# Patient Record
Sex: Male | Born: 1955 | Race: Black or African American | Hispanic: No | Marital: Single | Smoking: Current every day smoker
Health system: Southern US, Community
[De-identification: ages and names within clinical notes are randomized; demographics above are authoritative.]

## PROBLEM LIST (undated history)

## (undated) DIAGNOSIS — M1990 Unspecified osteoarthritis, unspecified site: Secondary | ICD-10-CM

## (undated) DIAGNOSIS — N189 Chronic kidney disease, unspecified: Secondary | ICD-10-CM

## (undated) DIAGNOSIS — K219 Gastro-esophageal reflux disease without esophagitis: Secondary | ICD-10-CM

## (undated) DIAGNOSIS — D649 Anemia, unspecified: Secondary | ICD-10-CM

## (undated) DIAGNOSIS — C8590 Non-Hodgkin lymphoma, unspecified, unspecified site: Secondary | ICD-10-CM

## (undated) HISTORY — DX: Anemia, unspecified: D64.9

## (undated) HISTORY — DX: Unspecified osteoarthritis, unspecified site: M19.90

## (undated) HISTORY — DX: Chronic kidney disease, unspecified: N18.9

## (undated) HISTORY — DX: Non-Hodgkin lymphoma, unspecified, unspecified site: C85.90

## (undated) HISTORY — PX: BONE MARROW BIOPSY: SHX199

## (undated) HISTORY — DX: Gastro-esophageal reflux disease without esophagitis: K21.9

---

## 1979-04-12 HISTORY — PX: OTHER SURGICAL HISTORY: SHX169

## 2010-04-11 HISTORY — PX: OTHER SURGICAL HISTORY: SHX169

## 2012-03-21 ENCOUNTER — Ambulatory Visit: Payer: Self-pay | Admitting: Family Medicine

## 2012-05-18 ENCOUNTER — Ambulatory Visit: Payer: Self-pay | Admitting: Internal Medicine

## 2012-05-21 ENCOUNTER — Ambulatory Visit: Payer: Self-pay | Admitting: Internal Medicine

## 2012-06-09 ENCOUNTER — Ambulatory Visit: Payer: Self-pay | Admitting: Internal Medicine

## 2012-11-29 ENCOUNTER — Ambulatory Visit: Payer: Self-pay | Admitting: Internal Medicine

## 2012-11-30 LAB — CBC CANCER CENTER
Basophil #: 0 x10 3/mm (ref 0.0–0.1)
Basophil %: 0.1 %
Eosinophil %: 1 %
HCT: 37.2 % — ABNORMAL LOW (ref 40.0–52.0)
HGB: 12.5 g/dL — ABNORMAL LOW (ref 13.0–18.0)
Lymphocyte %: 27.9 %
MCH: 29.3 pg (ref 26.0–34.0)
MCHC: 33.6 g/dL (ref 32.0–36.0)
MCV: 87 fL (ref 80–100)
Monocyte #: 0.7 x10 3/mm (ref 0.2–1.0)
Neutrophil #: 4.7 x10 3/mm (ref 1.4–6.5)
Neutrophil %: 61.6 %
Platelet: 289 x10 3/mm (ref 150–440)
RDW: 15.1 % — ABNORMAL HIGH (ref 11.5–14.5)
WBC: 7.6 x10 3/mm (ref 3.8–10.6)

## 2012-11-30 LAB — CREATININE, SERUM: EGFR (African American): 44 — ABNORMAL LOW

## 2012-11-30 LAB — CALCIUM: Calcium, Total: 9.3 mg/dL (ref 8.5–10.1)

## 2012-12-03 LAB — URINE IEP, RANDOM

## 2012-12-06 LAB — PROT IMMUNOELECTROPHORES(ARMC)

## 2012-12-10 ENCOUNTER — Ambulatory Visit: Payer: Self-pay | Admitting: Internal Medicine

## 2012-12-12 LAB — CBC CANCER CENTER
Comment - H1-Com4: NORMAL
Eosinophil: 3 %
HCT: 41.1 % (ref 40.0–52.0)
HGB: 13.7 g/dL (ref 13.0–18.0)
Lymphocytes: 26 %
MCH: 29.1 pg (ref 26.0–34.0)
MCV: 87 fL (ref 80–100)
Monocytes: 7 %
Platelet: 317 x10 3/mm (ref 150–440)
RBC: 4.71 10*6/uL (ref 4.40–5.90)
RDW: 14.8 % — ABNORMAL HIGH (ref 11.5–14.5)
Segmented Neutrophils: 64 %

## 2012-12-12 LAB — RETICULOCYTES: Reticulocyte: 1.11 % (ref 0.4–3.1)

## 2013-01-01 LAB — CBC CANCER CENTER
Basophil #: 0.1 x10 3/mm (ref 0.0–0.1)
Eosinophil #: 0.1 x10 3/mm (ref 0.0–0.7)
Eosinophil %: 1.4 %
HCT: 39.9 % — ABNORMAL LOW (ref 40.0–52.0)
Lymphocyte #: 2.4 x10 3/mm (ref 1.0–3.6)
Lymphocyte %: 29.2 %
MCHC: 32.8 g/dL (ref 32.0–36.0)
MCV: 88 fL (ref 80–100)
Monocyte #: 0.7 x10 3/mm (ref 0.2–1.0)
Neutrophil #: 4.8 x10 3/mm (ref 1.4–6.5)
Neutrophil %: 59.4 %
Platelet: 305 x10 3/mm (ref 150–440)
RBC: 4.53 10*6/uL (ref 4.40–5.90)
RDW: 14.7 % — ABNORMAL HIGH (ref 11.5–14.5)

## 2013-01-01 LAB — LACTATE DEHYDROGENASE: LDH: 147 U/L (ref 85–241)

## 2013-01-01 LAB — BASIC METABOLIC PANEL
Anion Gap: 6 — ABNORMAL LOW (ref 7–16)
BUN: 19 mg/dL — ABNORMAL HIGH (ref 7–18)
Calcium, Total: 9.2 mg/dL (ref 8.5–10.1)
Creatinine: 1.71 mg/dL — ABNORMAL HIGH (ref 0.60–1.30)
EGFR (African American): 50 — ABNORMAL LOW
Osmolality: 284 (ref 275–301)
Potassium: 3.7 mmol/L (ref 3.5–5.1)

## 2013-01-01 LAB — HEPATIC FUNCTION PANEL A (ARMC)
Albumin: 3.6 g/dL (ref 3.4–5.0)
Alkaline Phosphatase: 65 U/L (ref 50–136)
SGOT(AST): 20 U/L (ref 15–37)
Total Protein: 7.6 g/dL (ref 6.4–8.2)

## 2013-01-08 LAB — CBC CANCER CENTER
Basophil #: 0.1 x10 3/mm (ref 0.0–0.1)
Basophil %: 1.2 %
Eosinophil %: 2.1 %
HCT: 38.9 % — ABNORMAL LOW (ref 40.0–52.0)
Lymphocyte %: 25.1 %
MCH: 28.5 pg (ref 26.0–34.0)
MCHC: 32.9 g/dL (ref 32.0–36.0)
Monocyte #: 0.6 x10 3/mm (ref 0.2–1.0)
Monocyte %: 8 %
Neutrophil #: 4.5 x10 3/mm (ref 1.4–6.5)
RBC: 4.49 10*6/uL (ref 4.40–5.90)
RDW: 15 % — ABNORMAL HIGH (ref 11.5–14.5)
WBC: 7.1 x10 3/mm (ref 3.8–10.6)

## 2013-01-08 LAB — CREATININE, SERUM: EGFR (African American): 58 — ABNORMAL LOW

## 2013-01-09 ENCOUNTER — Ambulatory Visit: Payer: Self-pay | Admitting: Internal Medicine

## 2013-01-15 LAB — CBC CANCER CENTER
Basophil #: 0.1 x10 3/mm (ref 0.0–0.1)
Eosinophil #: 0.1 x10 3/mm (ref 0.0–0.7)
Eosinophil %: 1.8 %
Lymphocyte #: 2 x10 3/mm (ref 1.0–3.6)
Monocyte #: 0.7 x10 3/mm (ref 0.2–1.0)
Monocyte %: 9.4 %
Neutrophil #: 4.3 x10 3/mm (ref 1.4–6.5)
Neutrophil %: 59.8 %
Platelet: 329 x10 3/mm (ref 150–440)

## 2013-01-22 LAB — CBC CANCER CENTER
Basophil #: 0.1 x10 3/mm (ref 0.0–0.1)
Eosinophil #: 0.1 x10 3/mm (ref 0.0–0.7)
HGB: 12.7 g/dL — ABNORMAL LOW (ref 13.0–18.0)
Lymphocyte #: 2.8 x10 3/mm (ref 1.0–3.6)
Lymphocyte %: 29 %
MCHC: 33.6 g/dL (ref 32.0–36.0)
Monocyte #: 0.9 x10 3/mm (ref 0.2–1.0)
Monocyte %: 9 %
RDW: 14.9 % — ABNORMAL HIGH (ref 11.5–14.5)
WBC: 9.5 x10 3/mm (ref 3.8–10.6)

## 2013-01-22 LAB — BASIC METABOLIC PANEL
Anion Gap: 8 (ref 7–16)
BUN: 21 mg/dL — ABNORMAL HIGH (ref 7–18)
Calcium, Total: 9.1 mg/dL (ref 8.5–10.1)
Chloride: 106 mmol/L (ref 98–107)
Co2: 23 mmol/L (ref 21–32)
Creatinine: 1.76 mg/dL — ABNORMAL HIGH (ref 0.60–1.30)
Glucose: 91 mg/dL (ref 65–99)
Osmolality: 276 (ref 275–301)
Potassium: 4.1 mmol/L (ref 3.5–5.1)

## 2013-01-29 LAB — CBC CANCER CENTER
Basophil #: 0.1 x10 3/mm (ref 0.0–0.1)
Basophil %: 1 %
Eosinophil #: 0.1 x10 3/mm (ref 0.0–0.7)
Eosinophil %: 1.6 %
HCT: 38.4 % — ABNORMAL LOW (ref 40.0–52.0)
Lymphocyte #: 1.8 x10 3/mm (ref 1.0–3.6)
Lymphocyte %: 24.4 %
MCH: 29.2 pg (ref 26.0–34.0)
MCHC: 33.4 g/dL (ref 32.0–36.0)
MCV: 87 fL (ref 80–100)
Monocyte #: 0.7 x10 3/mm (ref 0.2–1.0)
Neutrophil #: 4.8 x10 3/mm (ref 1.4–6.5)
Neutrophil %: 63.6 %
RBC: 4.39 10*6/uL — ABNORMAL LOW (ref 4.40–5.90)
WBC: 7.5 x10 3/mm (ref 3.8–10.6)

## 2013-02-09 ENCOUNTER — Ambulatory Visit: Payer: Self-pay | Admitting: Internal Medicine

## 2013-03-26 ENCOUNTER — Ambulatory Visit: Payer: Self-pay | Admitting: Internal Medicine

## 2013-03-26 LAB — BASIC METABOLIC PANEL
BUN: 18 mg/dL (ref 7–18)
Calcium, Total: 9.1 mg/dL (ref 8.5–10.1)
Chloride: 104 mmol/L (ref 98–107)
EGFR (African American): 45 — ABNORMAL LOW
Glucose: 89 mg/dL (ref 65–99)
Potassium: 4.1 mmol/L (ref 3.5–5.1)
Sodium: 139 mmol/L (ref 136–145)

## 2013-03-26 LAB — HEPATIC FUNCTION PANEL A (ARMC)
Albumin: 3.5 g/dL (ref 3.4–5.0)
Alkaline Phosphatase: 50 U/L
Bilirubin, Direct: 0.1 mg/dL (ref 0.00–0.20)
Bilirubin,Total: 0.3 mg/dL (ref 0.2–1.0)
SGPT (ALT): 19 U/L (ref 12–78)
Total Protein: 7.4 g/dL (ref 6.4–8.2)

## 2013-03-26 LAB — CBC CANCER CENTER
Basophil %: 1.6 %
Eosinophil #: 0.1 x10 3/mm (ref 0.0–0.7)
Eosinophil %: 1.8 %
HGB: 12.9 g/dL — ABNORMAL LOW (ref 13.0–18.0)
MCH: 28.5 pg (ref 26.0–34.0)
MCHC: 32.4 g/dL (ref 32.0–36.0)
MCV: 88 fL (ref 80–100)
Neutrophil #: 3.2 x10 3/mm (ref 1.4–6.5)
Neutrophil %: 55.4 %
Platelet: 281 x10 3/mm (ref 150–440)
RBC: 4.54 10*6/uL (ref 4.40–5.90)
WBC: 5.8 x10 3/mm (ref 3.8–10.6)

## 2013-04-11 ENCOUNTER — Ambulatory Visit: Payer: Self-pay | Admitting: Internal Medicine

## 2013-05-07 LAB — CBC CANCER CENTER
Basophil #: 0.1 x10 3/mm (ref 0.0–0.1)
Basophil %: 1.4 %
Eosinophil #: 0.1 x10 3/mm (ref 0.0–0.7)
Eosinophil %: 1.6 %
HCT: 41 % (ref 40.0–52.0)
HGB: 13.3 g/dL (ref 13.0–18.0)
LYMPHS ABS: 2.7 x10 3/mm (ref 1.0–3.6)
LYMPHS PCT: 38.7 %
MCH: 28.2 pg (ref 26.0–34.0)
MCHC: 32.5 g/dL (ref 32.0–36.0)
MCV: 87 fL (ref 80–100)
MONO ABS: 0.8 x10 3/mm (ref 0.2–1.0)
Monocyte %: 11.8 %
NEUTROS PCT: 46.5 %
Neutrophil #: 3.3 x10 3/mm (ref 1.4–6.5)
Platelet: 309 x10 3/mm (ref 150–440)
RBC: 4.72 10*6/uL (ref 4.40–5.90)
RDW: 15.5 % — ABNORMAL HIGH (ref 11.5–14.5)
WBC: 7 x10 3/mm (ref 3.8–10.6)

## 2013-05-12 ENCOUNTER — Ambulatory Visit: Payer: Self-pay | Admitting: Internal Medicine

## 2013-06-18 ENCOUNTER — Ambulatory Visit: Payer: Self-pay | Admitting: Internal Medicine

## 2013-06-18 LAB — CBC CANCER CENTER
BASOS ABS: 0.1 x10 3/mm (ref 0.0–0.1)
Basophil %: 1.1 %
EOS PCT: 2.1 %
Eosinophil #: 0.1 x10 3/mm (ref 0.0–0.7)
Eosinophil: 1 %
HCT: 38.6 % — AB (ref 40.0–52.0)
HGB: 12.4 g/dL — ABNORMAL LOW (ref 13.0–18.0)
LYMPHS ABS: 1.7 x10 3/mm (ref 1.0–3.6)
LYMPHS PCT: 39 %
Lymphocyte %: 30.9 %
MCH: 28.4 pg (ref 26.0–34.0)
MCHC: 32.2 g/dL (ref 32.0–36.0)
MCV: 88 fL (ref 80–100)
MONOS PCT: 9 %
Monocyte #: 0.6 x10 3/mm (ref 0.2–1.0)
Monocyte %: 10.5 %
Neutrophil #: 3 x10 3/mm (ref 1.4–6.5)
Neutrophil %: 55.4 %
PLATELETS: 263 x10 3/mm (ref 150–440)
RBC: 4.38 10*6/uL — ABNORMAL LOW (ref 4.40–5.90)
RDW: 15.2 % — ABNORMAL HIGH (ref 11.5–14.5)
SEGMENTED NEUTROPHILS: 51 %
WBC: 5.4 x10 3/mm (ref 3.8–10.6)

## 2013-06-18 LAB — CREATININE, SERUM
CREATININE: 1.78 mg/dL — AB (ref 0.60–1.30)
EGFR (African American): 48 — ABNORMAL LOW
EGFR (Non-African Amer.): 41 — ABNORMAL LOW

## 2013-06-18 LAB — HEPATIC FUNCTION PANEL A (ARMC)
Albumin: 3.3 g/dL — ABNORMAL LOW (ref 3.4–5.0)
Alkaline Phosphatase: 53 U/L
Bilirubin, Direct: 0.1 mg/dL (ref 0.00–0.20)
Bilirubin,Total: 0.3 mg/dL (ref 0.2–1.0)
SGOT(AST): 15 U/L (ref 15–37)
SGPT (ALT): 20 U/L (ref 12–78)
Total Protein: 6.8 g/dL (ref 6.4–8.2)

## 2013-06-18 LAB — LACTATE DEHYDROGENASE: LDH: 142 U/L (ref 85–241)

## 2013-07-10 ENCOUNTER — Ambulatory Visit: Payer: Self-pay | Admitting: Internal Medicine

## 2013-07-17 LAB — CBC CANCER CENTER
BASOS ABS: 0.1 x10 3/mm (ref 0.0–0.1)
Basophil %: 1 %
EOS PCT: 1.1 %
Eosinophil #: 0.1 x10 3/mm (ref 0.0–0.7)
HCT: 41.6 % (ref 40.0–52.0)
HGB: 13.5 g/dL (ref 13.0–18.0)
LYMPHS PCT: 28.9 %
Lymphocyte #: 2.2 x10 3/mm (ref 1.0–3.6)
MCH: 28.9 pg (ref 26.0–34.0)
MCHC: 32.4 g/dL (ref 32.0–36.0)
MCV: 89 fL (ref 80–100)
MONO ABS: 0.7 x10 3/mm (ref 0.2–1.0)
Monocyte %: 9.9 %
Neutrophil #: 4.4 x10 3/mm (ref 1.4–6.5)
Neutrophil %: 59.1 %
Platelet: 301 x10 3/mm (ref 150–440)
RBC: 4.67 10*6/uL (ref 4.40–5.90)
RDW: 15.6 % — AB (ref 11.5–14.5)
WBC: 7.5 x10 3/mm (ref 3.8–10.6)

## 2013-07-17 LAB — BASIC METABOLIC PANEL
Anion Gap: 4 — ABNORMAL LOW (ref 7–16)
BUN: 19 mg/dL — ABNORMAL HIGH (ref 7–18)
CO2: 25 mmol/L (ref 21–32)
CREATININE: 1.92 mg/dL — AB (ref 0.60–1.30)
Calcium, Total: 8.7 mg/dL (ref 8.5–10.1)
Chloride: 109 mmol/L — ABNORMAL HIGH (ref 98–107)
EGFR (African American): 44 — ABNORMAL LOW
EGFR (Non-African Amer.): 38 — ABNORMAL LOW
Glucose: 106 mg/dL — ABNORMAL HIGH (ref 65–99)
Osmolality: 278 (ref 275–301)
POTASSIUM: 4 mmol/L (ref 3.5–5.1)
SODIUM: 138 mmol/L (ref 136–145)

## 2013-07-30 LAB — CBC CANCER CENTER
BASOS ABS: 0.1 x10 3/mm (ref 0.0–0.1)
BASOS PCT: 1 %
Eosinophil #: 0.1 x10 3/mm (ref 0.0–0.7)
Eosinophil %: 0.8 %
HCT: 41.8 % (ref 40.0–52.0)
HGB: 13.6 g/dL (ref 13.0–18.0)
LYMPHS PCT: 28 %
Lymphocyte #: 1.8 x10 3/mm (ref 1.0–3.6)
MCH: 29 pg (ref 26.0–34.0)
MCHC: 32.6 g/dL (ref 32.0–36.0)
MCV: 89 fL (ref 80–100)
MONO ABS: 0.6 x10 3/mm (ref 0.2–1.0)
Monocyte %: 10 %
NEUTROS ABS: 3.8 x10 3/mm (ref 1.4–6.5)
Neutrophil %: 60.2 %
Platelet: 310 x10 3/mm (ref 150–440)
RBC: 4.7 10*6/uL (ref 4.40–5.90)
RDW: 15.5 % — AB (ref 11.5–14.5)
WBC: 6.4 x10 3/mm (ref 3.8–10.6)

## 2013-08-09 ENCOUNTER — Ambulatory Visit: Payer: Self-pay | Admitting: Internal Medicine

## 2013-09-09 ENCOUNTER — Ambulatory Visit: Payer: Self-pay | Admitting: Internal Medicine

## 2013-09-10 LAB — HEPATIC FUNCTION PANEL A (ARMC)
Albumin: 3.6 g/dL (ref 3.4–5.0)
Alkaline Phosphatase: 55 U/L
BILIRUBIN TOTAL: 0.6 mg/dL (ref 0.2–1.0)
Bilirubin, Direct: 0.2 mg/dL (ref 0.00–0.20)
SGOT(AST): 19 U/L (ref 15–37)
SGPT (ALT): 20 U/L (ref 12–78)
Total Protein: 6.9 g/dL (ref 6.4–8.2)

## 2013-09-10 LAB — CBC CANCER CENTER
BASOS ABS: 0.1 x10 3/mm (ref 0.0–0.1)
Basophil %: 1.2 %
EOS PCT: 1.4 %
Eosinophil #: 0.1 x10 3/mm (ref 0.0–0.7)
HCT: 38.1 % — ABNORMAL LOW (ref 40.0–52.0)
HGB: 12.8 g/dL — ABNORMAL LOW (ref 13.0–18.0)
LYMPHS ABS: 1.9 x10 3/mm (ref 1.0–3.6)
Lymphocyte %: 22.9 %
MCH: 29.8 pg (ref 26.0–34.0)
MCHC: 33.6 g/dL (ref 32.0–36.0)
MCV: 89 fL (ref 80–100)
Monocyte #: 0.7 x10 3/mm (ref 0.2–1.0)
Monocyte %: 8.4 %
NEUTROS ABS: 5.6 x10 3/mm (ref 1.4–6.5)
Neutrophil %: 66.1 %
PLATELETS: 247 x10 3/mm (ref 150–440)
RBC: 4.29 10*6/uL — AB (ref 4.40–5.90)
RDW: 15.2 % — AB (ref 11.5–14.5)
WBC: 8.4 x10 3/mm (ref 3.8–10.6)

## 2013-09-10 LAB — CREATININE, SERUM
CREATININE: 1.61 mg/dL — AB (ref 0.60–1.30)
EGFR (African American): 54 — ABNORMAL LOW
EGFR (Non-African Amer.): 46 — ABNORMAL LOW

## 2013-09-10 LAB — LACTATE DEHYDROGENASE: LDH: 145 U/L (ref 85–241)

## 2013-10-09 ENCOUNTER — Ambulatory Visit: Payer: Self-pay | Admitting: Internal Medicine

## 2013-10-22 LAB — CBC CANCER CENTER
BASOS PCT: 1.4 %
Basophil #: 0.1 x10 3/mm (ref 0.0–0.1)
EOS ABS: 0.1 x10 3/mm (ref 0.0–0.7)
Eosinophil %: 1.8 %
HCT: 40.7 % (ref 40.0–52.0)
HGB: 13.2 g/dL (ref 13.0–18.0)
LYMPHS PCT: 32.7 %
Lymphocyte #: 1.8 x10 3/mm (ref 1.0–3.6)
MCH: 29.1 pg (ref 26.0–34.0)
MCHC: 32.4 g/dL (ref 32.0–36.0)
MCV: 90 fL (ref 80–100)
Monocyte #: 0.7 x10 3/mm (ref 0.2–1.0)
Monocyte %: 12.2 %
Neutrophil #: 2.8 x10 3/mm (ref 1.4–6.5)
Neutrophil %: 51.9 %
Platelet: 309 x10 3/mm (ref 150–440)
RBC: 4.52 10*6/uL (ref 4.40–5.90)
RDW: 15.2 % — AB (ref 11.5–14.5)
WBC: 5.4 x10 3/mm (ref 3.8–10.6)

## 2013-10-22 LAB — RETICULOCYTES
ABSOLUTE RETIC COUNT: 0.0359 10*6/uL (ref 0.019–0.186)
RETICULOCYTE: 0.79 % (ref 0.4–3.1)

## 2013-11-08 LAB — CBC CANCER CENTER
BASOS ABS: 0 x10 3/mm (ref 0.0–0.1)
Basophil %: 0.3 %
EOS PCT: 1.3 %
Eosinophil #: 0.1 x10 3/mm (ref 0.0–0.7)
HCT: 39.5 % — ABNORMAL LOW (ref 40.0–52.0)
HGB: 12.9 g/dL — AB (ref 13.0–18.0)
LYMPHS ABS: 1.8 x10 3/mm (ref 1.0–3.6)
LYMPHS PCT: 29.9 %
MCH: 29 pg (ref 26.0–34.0)
MCHC: 32.7 g/dL (ref 32.0–36.0)
MCV: 89 fL (ref 80–100)
Monocyte #: 0.7 x10 3/mm (ref 0.2–1.0)
Monocyte %: 11.3 %
Neutrophil #: 3.4 x10 3/mm (ref 1.4–6.5)
Neutrophil %: 57.2 %
Platelet: 293 x10 3/mm (ref 150–440)
RBC: 4.44 10*6/uL (ref 4.40–5.90)
RDW: 14.8 % — ABNORMAL HIGH (ref 11.5–14.5)
WBC: 6 x10 3/mm (ref 3.8–10.6)

## 2013-11-08 LAB — BASIC METABOLIC PANEL
ANION GAP: 8 (ref 7–16)
BUN: 18 mg/dL (ref 7–18)
CO2: 27 mmol/L (ref 21–32)
Calcium, Total: 8.8 mg/dL (ref 8.5–10.1)
Chloride: 104 mmol/L (ref 98–107)
Creatinine: 1.61 mg/dL — ABNORMAL HIGH (ref 0.60–1.30)
EGFR (African American): 53 — ABNORMAL LOW
EGFR (Non-African Amer.): 46 — ABNORMAL LOW
Glucose: 92 mg/dL (ref 65–99)
OSMOLALITY: 279 (ref 275–301)
POTASSIUM: 4.3 mmol/L (ref 3.5–5.1)
Sodium: 139 mmol/L (ref 136–145)

## 2013-11-09 ENCOUNTER — Ambulatory Visit: Payer: Self-pay | Admitting: Internal Medicine

## 2014-01-03 ENCOUNTER — Ambulatory Visit: Payer: Self-pay | Admitting: Internal Medicine

## 2014-01-03 LAB — HEPATIC FUNCTION PANEL A (ARMC)
ALBUMIN: 3.8 g/dL (ref 3.4–5.0)
Alkaline Phosphatase: 55 U/L
Bilirubin, Direct: 0.1 mg/dL (ref 0.00–0.20)
Bilirubin,Total: 0.5 mg/dL (ref 0.2–1.0)
SGOT(AST): 17 U/L (ref 15–37)
SGPT (ALT): 31 U/L
Total Protein: 6.9 g/dL (ref 6.4–8.2)

## 2014-01-03 LAB — CBC CANCER CENTER
BASOS PCT: 0.8 %
Basophil #: 0.1 x10 3/mm (ref 0.0–0.1)
EOS PCT: 1.5 %
Eosinophil #: 0.1 x10 3/mm (ref 0.0–0.7)
HCT: 40 % (ref 40.0–52.0)
HGB: 13 g/dL (ref 13.0–18.0)
Lymphocyte #: 1.9 x10 3/mm (ref 1.0–3.6)
Lymphocyte %: 25.1 %
MCH: 29 pg (ref 26.0–34.0)
MCHC: 32.6 g/dL (ref 32.0–36.0)
MCV: 89 fL (ref 80–100)
Monocyte #: 0.7 x10 3/mm (ref 0.2–1.0)
Monocyte %: 9 %
Neutrophil #: 4.9 x10 3/mm (ref 1.4–6.5)
Neutrophil %: 63.6 %
Platelet: 292 x10 3/mm (ref 150–440)
RBC: 4.49 10*6/uL (ref 4.40–5.90)
RDW: 14.9 % — AB (ref 11.5–14.5)
WBC: 7.7 x10 3/mm (ref 3.8–10.6)

## 2014-01-03 LAB — CREATININE, SERUM
Creatinine: 1.59 mg/dL — ABNORMAL HIGH (ref 0.60–1.30)
EGFR (African American): 58 — ABNORMAL LOW
EGFR (Non-African Amer.): 48 — ABNORMAL LOW

## 2014-01-03 LAB — LACTATE DEHYDROGENASE: LDH: 147 U/L (ref 85–241)

## 2014-01-09 ENCOUNTER — Ambulatory Visit: Payer: Self-pay | Admitting: Internal Medicine

## 2014-02-28 ENCOUNTER — Ambulatory Visit: Payer: Self-pay | Admitting: Internal Medicine

## 2014-02-28 LAB — CBC CANCER CENTER
Basophil #: 0.1 x10 3/mm (ref 0.0–0.1)
Basophil %: 0.8 %
EOS ABS: 0.1 x10 3/mm (ref 0.0–0.7)
Eosinophil %: 1.2 %
HCT: 39.4 % — ABNORMAL LOW (ref 40.0–52.0)
HGB: 12.7 g/dL — ABNORMAL LOW (ref 13.0–18.0)
Lymphocyte #: 1.6 x10 3/mm (ref 1.0–3.6)
Lymphocyte %: 25.8 %
MCH: 28.7 pg (ref 26.0–34.0)
MCHC: 32.1 g/dL (ref 32.0–36.0)
MCV: 89 fL (ref 80–100)
Monocyte #: 0.7 x10 3/mm (ref 0.2–1.0)
Monocyte %: 11.3 %
NEUTROS ABS: 3.7 x10 3/mm (ref 1.4–6.5)
Neutrophil %: 60.9 %
Platelet: 287 x10 3/mm (ref 150–440)
RBC: 4.42 10*6/uL (ref 4.40–5.90)
RDW: 14.6 % — AB (ref 11.5–14.5)
WBC: 6.1 x10 3/mm (ref 3.8–10.6)

## 2014-02-28 LAB — CREATININE, SERUM
CREATININE: 1.79 mg/dL — AB (ref 0.60–1.30)
EGFR (African American): 50 — ABNORMAL LOW
EGFR (Non-African Amer.): 42 — ABNORMAL LOW

## 2014-02-28 LAB — HEPATIC FUNCTION PANEL A (ARMC)
Albumin: 3.5 g/dL (ref 3.4–5.0)
Alkaline Phosphatase: 53 U/L
Bilirubin, Direct: 0.1 mg/dL (ref 0.0–0.2)
Bilirubin,Total: 0.3 mg/dL (ref 0.2–1.0)
SGOT(AST): 14 U/L — ABNORMAL LOW (ref 15–37)
SGPT (ALT): 19 U/L
Total Protein: 6.6 g/dL (ref 6.4–8.2)

## 2014-02-28 LAB — LACTATE DEHYDROGENASE: LDH: 136 U/L (ref 85–241)

## 2014-03-11 ENCOUNTER — Ambulatory Visit: Payer: Self-pay | Admitting: Internal Medicine

## 2014-04-25 ENCOUNTER — Ambulatory Visit: Payer: Self-pay | Admitting: Internal Medicine

## 2014-04-25 LAB — CBC CANCER CENTER
Basophil #: 0 x10 3/mm (ref 0.0–0.1)
Basophil %: 0.1 %
EOS PCT: 1.1 %
Eosinophil #: 0.1 x10 3/mm (ref 0.0–0.7)
HCT: 38.5 % — ABNORMAL LOW (ref 40.0–52.0)
HGB: 12.4 g/dL — AB (ref 13.0–18.0)
Lymphocyte #: 1.4 x10 3/mm (ref 1.0–3.6)
Lymphocyte %: 25.8 %
MCH: 28.2 pg (ref 26.0–34.0)
MCHC: 32.3 g/dL (ref 32.0–36.0)
MCV: 87 fL (ref 80–100)
MONOS PCT: 9.2 %
Monocyte #: 0.5 x10 3/mm (ref 0.2–1.0)
NEUTROS ABS: 3.4 x10 3/mm (ref 1.4–6.5)
Neutrophil %: 63.8 %
Platelet: 271 x10 3/mm (ref 150–440)
RBC: 4.4 10*6/uL (ref 4.40–5.90)
RDW: 15.1 % — ABNORMAL HIGH (ref 11.5–14.5)
WBC: 5.4 x10 3/mm (ref 3.8–10.6)

## 2014-04-25 LAB — CREATININE, SERUM
CREATININE: 1.85 mg/dL — AB (ref 0.60–1.30)
EGFR (African American): 48 — ABNORMAL LOW
GFR CALC NON AF AMER: 40 — AB

## 2014-04-25 LAB — LACTATE DEHYDROGENASE: LDH: 164 U/L (ref 85–241)

## 2014-05-12 ENCOUNTER — Ambulatory Visit: Payer: Self-pay | Admitting: Internal Medicine

## 2014-06-27 ENCOUNTER — Ambulatory Visit
Admit: 2014-06-27 | Disposition: A | Discharge status: Home / Self Care | Payer: Self-pay | Attending: Internal Medicine | Admitting: Internal Medicine

## 2014-06-27 LAB — CREATININE, SERUM: Creatine, Serum: 1.59

## 2014-07-11 ENCOUNTER — Ambulatory Visit
Admit: 2014-07-11 | Disposition: A | Discharge status: Home / Self Care | Payer: Self-pay | Attending: Internal Medicine | Admitting: Internal Medicine

## 2014-08-19 ENCOUNTER — Other Ambulatory Visit: Payer: Self-pay | Admitting: *Deleted

## 2014-08-19 DIAGNOSIS — D47Z9 Other specified neoplasms of uncertain behavior of lymphoid, hematopoietic and related tissue: Secondary | ICD-10-CM

## 2014-08-20 ENCOUNTER — Other Ambulatory Visit: Payer: Self-pay | Admitting: Internal Medicine

## 2014-08-20 DIAGNOSIS — C8590 Non-Hodgkin lymphoma, unspecified, unspecified site: Secondary | ICD-10-CM | POA: Insufficient documentation

## 2014-08-22 ENCOUNTER — Inpatient Hospital Stay: Payer: BLUE CROSS/BLUE SHIELD | Attending: Internal Medicine

## 2014-08-22 ENCOUNTER — Inpatient Hospital Stay: Payer: BLUE CROSS/BLUE SHIELD

## 2014-08-22 ENCOUNTER — Encounter (INDEPENDENT_AMBULATORY_CARE_PROVIDER_SITE_OTHER): Payer: Self-pay

## 2014-08-22 VITALS — BP 112/71 | HR 74 | Temp 96.0°F | Resp 18

## 2014-08-22 DIAGNOSIS — Z5111 Encounter for antineoplastic chemotherapy: Secondary | ICD-10-CM | POA: Insufficient documentation

## 2014-08-22 DIAGNOSIS — D479 Neoplasm of uncertain behavior of lymphoid, hematopoietic and related tissue, unspecified: Secondary | ICD-10-CM | POA: Diagnosis present

## 2014-08-22 DIAGNOSIS — D472 Monoclonal gammopathy: Secondary | ICD-10-CM | POA: Insufficient documentation

## 2014-08-22 DIAGNOSIS — D47Z9 Other specified neoplasms of uncertain behavior of lymphoid, hematopoietic and related tissue: Secondary | ICD-10-CM

## 2014-08-22 DIAGNOSIS — D631 Anemia in chronic kidney disease: Secondary | ICD-10-CM | POA: Diagnosis not present

## 2014-08-22 DIAGNOSIS — C8590 Non-Hodgkin lymphoma, unspecified, unspecified site: Secondary | ICD-10-CM

## 2014-08-22 DIAGNOSIS — Z79899 Other long term (current) drug therapy: Secondary | ICD-10-CM | POA: Insufficient documentation

## 2014-08-22 DIAGNOSIS — N183 Chronic kidney disease, stage 3 (moderate): Secondary | ICD-10-CM | POA: Insufficient documentation

## 2014-08-22 LAB — CBC WITH DIFFERENTIAL/PLATELET
BASOS ABS: 0.1 10*3/uL (ref 0–0.1)
BASOS PCT: 1 %
Eosinophils Absolute: 0.1 10*3/uL (ref 0–0.7)
Eosinophils Relative: 1 %
HEMATOCRIT: 40 % (ref 40.0–52.0)
HEMOGLOBIN: 12.9 g/dL — AB (ref 13.0–18.0)
Lymphocytes Relative: 37 %
Lymphs Abs: 2.6 10*3/uL (ref 1.0–3.6)
MCH: 28.7 pg (ref 26.0–34.0)
MCHC: 32.3 g/dL (ref 32.0–36.0)
MCV: 88.9 fL (ref 80.0–100.0)
MONOS PCT: 8 %
Monocytes Absolute: 0.6 10*3/uL (ref 0.2–1.0)
NEUTROS PCT: 53 %
Neutro Abs: 3.9 10*3/uL (ref 1.4–6.5)
Platelets: 254 10*3/uL (ref 150–440)
RBC: 4.5 MIL/uL (ref 4.40–5.90)
RDW: 14.8 % — AB (ref 11.5–14.5)
WBC: 7.2 10*3/uL (ref 3.8–10.6)

## 2014-08-22 MED ORDER — SODIUM CHLORIDE 0.9 % IV SOLN
10.0000 mg | Freq: Once | INTRAVENOUS | Status: AC
  Administered 2014-08-22: 10 mg via INTRAVENOUS
  Filled 2014-08-22: qty 1

## 2014-08-22 MED ORDER — SODIUM CHLORIDE 0.9 % IV SOLN
Freq: Once | INTRAVENOUS | Status: AC
  Administered 2014-08-22: 10:00:00 via INTRAVENOUS
  Filled 2014-08-22: qty 250

## 2014-08-22 MED ORDER — SODIUM CHLORIDE 0.9 % IV SOLN
600.0000 mg | Freq: Once | INTRAVENOUS | Status: AC
  Administered 2014-08-22: 600 mg via INTRAVENOUS
  Filled 2014-08-22: qty 50

## 2014-08-22 MED ORDER — ACETAMINOPHEN 325 MG PO TABS
650.0000 mg | ORAL_TABLET | Freq: Once | ORAL | Status: AC
  Administered 2014-08-22: 650 mg via ORAL
  Filled 2014-08-22: qty 2

## 2014-08-22 MED ORDER — DIPHENHYDRAMINE HCL 50 MG/ML IJ SOLN
25.0000 mg | Freq: Once | INTRAMUSCULAR | Status: AC
  Administered 2014-08-22: 25 mg via INTRAVENOUS
  Filled 2014-08-22: qty 1

## 2014-08-22 MED ORDER — RITUXIMAB CHEMO INJECTION 500 MG/50ML: 375.0000 mg/m2 | Freq: Once | INTRAVENOUS | Status: DC

## 2014-10-17 ENCOUNTER — Inpatient Hospital Stay (HOSPITAL_BASED_OUTPATIENT_CLINIC_OR_DEPARTMENT_OTHER): Payer: BLUE CROSS/BLUE SHIELD | Admitting: Internal Medicine

## 2014-10-17 ENCOUNTER — Encounter: Payer: Self-pay | Admitting: Internal Medicine

## 2014-10-17 ENCOUNTER — Inpatient Hospital Stay: Payer: BLUE CROSS/BLUE SHIELD

## 2014-10-17 ENCOUNTER — Inpatient Hospital Stay: Payer: BLUE CROSS/BLUE SHIELD | Attending: Internal Medicine

## 2014-10-17 VITALS — BP 123/83 | HR 84 | Temp 96.9°F | Resp 16 | Wt 152.8 lb

## 2014-10-17 DIAGNOSIS — N183 Chronic kidney disease, stage 3 (moderate): Secondary | ICD-10-CM

## 2014-10-17 DIAGNOSIS — Z5111 Encounter for antineoplastic chemotherapy: Secondary | ICD-10-CM | POA: Diagnosis not present

## 2014-10-17 DIAGNOSIS — D472 Monoclonal gammopathy: Secondary | ICD-10-CM | POA: Insufficient documentation

## 2014-10-17 DIAGNOSIS — C8590 Non-Hodgkin lymphoma, unspecified, unspecified site: Secondary | ICD-10-CM

## 2014-10-17 DIAGNOSIS — D631 Anemia in chronic kidney disease: Secondary | ICD-10-CM

## 2014-10-17 DIAGNOSIS — Z79899 Other long term (current) drug therapy: Secondary | ICD-10-CM | POA: Insufficient documentation

## 2014-10-17 DIAGNOSIS — D479 Neoplasm of uncertain behavior of lymphoid, hematopoietic and related tissue, unspecified: Secondary | ICD-10-CM | POA: Insufficient documentation

## 2014-10-17 DIAGNOSIS — D47Z9 Other specified neoplasms of uncertain behavior of lymphoid, hematopoietic and related tissue: Secondary | ICD-10-CM

## 2014-10-17 LAB — CBC WITH DIFFERENTIAL/PLATELET
BASOS PCT: 0 %
Basophils Absolute: 0 10*3/uL (ref 0–0.1)
EOS ABS: 0 10*3/uL (ref 0–0.7)
Eosinophils Relative: 1 %
HCT: 43.1 % (ref 40.0–52.0)
Hemoglobin: 14.2 g/dL (ref 13.0–18.0)
Lymphocytes Relative: 19 %
Lymphs Abs: 0.8 10*3/uL — ABNORMAL LOW (ref 1.0–3.6)
MCH: 29.1 pg (ref 26.0–34.0)
MCHC: 32.9 g/dL (ref 32.0–36.0)
MCV: 88.5 fL (ref 80.0–100.0)
MONO ABS: 0.4 10*3/uL (ref 0.2–1.0)
Monocytes Relative: 9 %
NEUTROS PCT: 71 %
Neutro Abs: 3.1 10*3/uL (ref 1.4–6.5)
Platelets: 223 10*3/uL (ref 150–440)
RBC: 4.87 MIL/uL (ref 4.40–5.90)
RDW: 14.8 % — AB (ref 11.5–14.5)
WBC: 4.4 10*3/uL (ref 3.8–10.6)

## 2014-10-17 LAB — LACTATE DEHYDROGENASE: LDH: 146 U/L (ref 98–192)

## 2014-10-17 LAB — CREATININE, SERUM
Creatinine, Ser: 1.67 mg/dL — ABNORMAL HIGH (ref 0.61–1.24)
GFR calc Af Amer: 50 mL/min — ABNORMAL LOW (ref >60)
GFR, EST NON AFRICAN AMERICAN: 43 mL/min — AB (ref >60)

## 2014-10-17 MED ORDER — SODIUM CHLORIDE 0.9 % IV SOLN
Freq: Once | INTRAVENOUS | Status: AC
  Administered 2014-10-17: 10:00:00 via INTRAVENOUS
  Filled 2014-10-17: qty 1000

## 2014-10-17 MED ORDER — DIPHENHYDRAMINE HCL 50 MG/ML IJ SOLN
25.0000 mg | Freq: Once | INTRAMUSCULAR | Status: AC
  Administered 2014-10-17: 25 mg via INTRAVENOUS
  Filled 2014-10-17: qty 1

## 2014-10-17 MED ORDER — ACETAMINOPHEN 325 MG PO TABS
650.0000 mg | ORAL_TABLET | Freq: Once | ORAL | Status: AC
  Administered 2014-10-17: 650 mg via ORAL
  Filled 2014-10-17: qty 2

## 2014-10-17 MED ORDER — SODIUM CHLORIDE 0.9 % IV SOLN
10.0000 mg | Freq: Once | INTRAVENOUS | Status: AC
  Administered 2014-10-17: 10 mg via INTRAVENOUS
  Filled 2014-10-17: qty 1

## 2014-10-17 MED ORDER — SODIUM CHLORIDE 0.9 % IV SOLN
375.0000 mg/m2 | Freq: Once | INTRAVENOUS | Status: AC
  Administered 2014-10-17: 600 mg via INTRAVENOUS
  Filled 2014-10-17: qty 50

## 2014-10-17 MED ORDER — SODIUM CHLORIDE 0.9 % IV SOLN: 375.0000 mg/m2 | Freq: Once | INTRAVENOUS | Status: DC

## 2014-10-17 NOTE — Progress Notes (Signed)
Family History-Remarkable for diabetes, heart diease, and prostate cancer.Marland KitchenMarland Kitchen

## 2014-10-26 NOTE — Progress Notes (Signed)
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   Per discussion with pathologist Dr. 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  (Bone marrow biopsy 12/12/12 reported normocellular to mildly hypercellular marrow for age ~30-40% with trilineage hematopoiesis, storage iron detected, involvement by low-grade B cell lymphoproliferative disorder interstitial infiltrative pattern ~30-40% of marrow cells.  Flow cytometry shows clonal B-cell population 6% which is CD20+, CD22+ dim CD5-, CD10-, CD11c-, CD19+, CD45+, CD38+, CD23-/+, CD103-, FMC-7+, HLA-DR+, slg kappa+.  Cytogenetics unremarkable 40 XY). 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    Got single-agent Rituxan on 01/08/13 - 01/29/13. Started maintenance Rituxan on 03/26/13. Repeat Bone marrow biopsy on 06/19/13 showed  - Involved by previously diagnosed low-grade B-cell lymphoproliferative disorder (intersittiasl and focally sinusoidal patters, ~20-25% of marrow cells. Flow study showed clonal B-cell population (~3%) and Karyotype was unremarkable 46XY.    HISTORY OF PRESENT ILLNESS:  Patient returns for oncology evaluation and plan continued treatment for h/o lymphoma as above, he is on maintenance Rituxan treatment. States that he is doing steady and denies any new complaints. States that chronic easy fatigue on exertion is unchanged. No new dyspnea, orthopnea or PND. Eating steady. No fevers or chills. Denies any new bone pains, has intermittent chronic right lower back pain and arthritic knee pain which is unchanged.  Denies feeling any new lymph node masses on self exam. No  nausea, vomiting or diarrhea. No new mood disturbances.  REVIEW OF SYSTEMS:   ROS As in HPI above. In addition, no fever, chills or sweats. No new headaches or focal weakness.  No new mood disturbances. No  sore throat, cough, shortness of breath, sputum, hemoptysis or chest pain. No dizziness or palpitation. No abdominal pain, constipation, diarrhea, dysuria or hematuria. No new skin rash or bleeding symptoms. No new paresthesias in extremities. PS ECOG 0.  PAST MEDICAL HISTORY: Reviewed. Past Medical History  Diagnosis Date  . Lymphoma   . Chronic kidney disease   . Anemia   . Arthritis   . GERD (gastroesophageal reflux disease)           Chronic kidney disease stage III felt likely secondary to nonsteroidal usage  Anemia of chronic disease  Arthritis  GERD  Left knee surgery ACL repair 1981  Left eye surgery 2012  PAST SURGICAL HISTORY: Reviewed. Past Surgical History  Procedure Laterality Date  . Left knee surgery  1981    ACL repair  . Left eye surgery  2012    FAMILY HISTORY: Reviewed. Remarkable for diabetes, heart disease, prostate cancer.  SOCIAL HISTORY: Reviewed. History  Substance Use Topics  . Smoking status: Current Every Day Smoker -- 0.25 packs/day    Types: Cigarettes  . Smokeless tobacco: Not on file  . Alcohol Use: Yes     Comment: consumes alcohol on the weekend.    Allergies  Allergen Reactions  . No Known Allergies     Current Outpatient Prescriptions  Medication Sig Dispense Refill  . cyclobenzaprine (FLEXERIL) 10 MG tablet Take 10 mg by mouth every 8 (eight) hours as needed.  0  . pantoprazole (PROTONIX) 40 MG tablet Take 90 mg by mouth daily.  0   No  current facility-administered medications for this visit.    PHYSICAL EXAM: Filed Vitals:   10/17/14 0908  BP: 123/83  Pulse: 84  Temp: 96.9 F (36.1 C)  Resp: 16     Body mass index is 34.42 kg/(m^2).    ECOG FS:0 - Asymptomatic  GENERAL: Patient is alert and oriented and in no  acute distress. There is no icterus. HEENT: EOMs intact. Oral exam negative for thrush or lesions. No cervical lymphadenopathy. CVS: S1S2, regular LUNGS: Bilaterally clear to auscultation, no rhonchi. ABDOMEN: Soft, nontender. No hepatosplenomegaly clinically.  NEURO: grossly nonfocal, cranial nerves are intact.   EXTREMITIES: No pedal edema. LYMPHATICS: No palpable adenopathy in axillary or inguinal areas.   LAB RESULTS:    Component Value Date/Time   NA 139 11/08/2013 0824   K 4.3 11/08/2013 0824   CL 104 11/08/2013 0824   CO2 27 11/08/2013 0824   GLUCOSE 92 11/08/2013 0824   BUN 18 11/08/2013 0824   CREATININE 1.67* 10/17/2014 0807   CREATININE 1.85* 04/25/2014 0840   CALCIUM 8.8 11/08/2013 0824   PROT 6.6 02/28/2014 0902   ALBUMIN 3.5 02/28/2014 0902   AST 14* 02/28/2014 0902   ALT 19 02/28/2014 0902   ALKPHOS 53 02/28/2014 0902   GFRNONAA 43* 10/17/2014 0807   GFRNONAA 46* 11/08/2013 0824   GFRAA 50* 10/17/2014 0807   GFRAA 53* 11/08/2013 0824   Lab Results  Component Value Date   WBC 4.4 10/17/2014   NEUTROABS 3.1 10/17/2014   HGB 14.2 10/17/2014   HCT 43.1 10/17/2014   MCV 88.5 10/17/2014   PLT 223 10/17/2014     STUDIES: 04/30/12 - Hb 12.8, Cr 1.53, calcium 9.4, free kappa light chains elevated at 120.33, free lambda light chains normal at 13.59, elevated kappa-lambda ratio at 8.85, SPEP showed 1 M-spike of 0.6 g/dL and additional M-spike at concentration of 0.4 g/dL. Random-UPEP showed M-spike of 15.3% along with additional M-spike concentration of 9.5%.  07/07/14 - CT Neck. IMPRESSION: Noncontrast study because of poor renal function. No lymphadenopathy or other active neck process.  07/07/14 - CT scan C/A/P.  IMPRESSION: 1. No adenopathy, splenomegaly or definite signs of recurrent lymphoma.  2. Nonspecific proximal gastric wall thickening, possibly due to incomplete distention.  3. Stable mild atherosclerosis. The ascending aorta is mildly dilated to 4.0 cm.  Recommend annual imaging followup by CTA or MRA. This recommendation follows 2010 ACCF/AHA/AATS/ACR/ASA/SCA/SCAI/SIR/STS/SVM Guidelines for the Diagnosis and Management of Patients with Thoracic Aortic Disease. Circulation. 2010; 121: E720-N470  4. No acute findings.   ASSESSMENT / PLAN:   1. 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  Per discussion with pathologist Dr. 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  History of MGUS diagnosed January 2014. Repeat Bone marrow on 06/19/13 reported Involved by previously diagnosed low-grade B-cell lymphoproliferative disorder (intersittiasl and focally sinusoidal patters, ~20-25% of marrow cells)   -   reviewed labs from today and d/w patient. Clinically doing well, no new adenopathy. No B symptoms, no hepatosplenomegaly or other superficial lymphadenopathy on exam today. Last bone marrow biopsy in March 2015 reported less involvemenrt by previously diagnosed low-grade lymphoproliferative disorder about 20 to 25% of marrow cells, previously was 30 to 40%. Recent CT scan of the neck/chest/abdomen/pelvis (without IV contrast given elevated Cr) did not report progressive/recurrent lymphoma. Will proceed with next scheduled dose of maintenance Rituxan treatment 375 mg/m2 IV with same premedication as before. Lab at 8 weeks and next  dose of Rituxan treatment. Next MD follow-up at 16 weeks with repeat CBC/differential, Cr, LDH, and plan next dose of Rituxan treatment. 2. Anemia - Hb normal today, continue to monitor.  3. Chronic renal insufficiency - Creatinine better as compared with January 2016, continue to monitor intermittently. 4. Nutrition - have again reminded to maintain adequate protein calorie intake. 5. In between visits, he was advised to call or come to ER in case of any new symptoms or acute sickness. Patient is agreeable to this plan.      Leia Alf, MD    10/26/2014 11:07 AM

## 2014-12-12 ENCOUNTER — Ambulatory Visit: Payer: BLUE CROSS/BLUE SHIELD

## 2014-12-12 ENCOUNTER — Other Ambulatory Visit: Payer: BLUE CROSS/BLUE SHIELD

## 2014-12-19 ENCOUNTER — Inpatient Hospital Stay: Payer: BLUE CROSS/BLUE SHIELD | Attending: Internal Medicine

## 2014-12-19 ENCOUNTER — Inpatient Hospital Stay: Payer: BLUE CROSS/BLUE SHIELD

## 2014-12-19 VITALS — BP 110/73 | HR 72 | Temp 95.9°F | Resp 20

## 2014-12-19 DIAGNOSIS — C8590 Non-Hodgkin lymphoma, unspecified, unspecified site: Secondary | ICD-10-CM

## 2014-12-19 DIAGNOSIS — Z5111 Encounter for antineoplastic chemotherapy: Secondary | ICD-10-CM | POA: Diagnosis not present

## 2014-12-19 DIAGNOSIS — D631 Anemia in chronic kidney disease: Secondary | ICD-10-CM | POA: Insufficient documentation

## 2014-12-19 DIAGNOSIS — N183 Chronic kidney disease, stage 3 (moderate): Secondary | ICD-10-CM | POA: Diagnosis not present

## 2014-12-19 DIAGNOSIS — Z79899 Other long term (current) drug therapy: Secondary | ICD-10-CM | POA: Diagnosis not present

## 2014-12-19 DIAGNOSIS — D472 Monoclonal gammopathy: Secondary | ICD-10-CM | POA: Insufficient documentation

## 2014-12-19 DIAGNOSIS — D479 Neoplasm of uncertain behavior of lymphoid, hematopoietic and related tissue, unspecified: Secondary | ICD-10-CM | POA: Diagnosis present

## 2014-12-19 LAB — CBC WITH DIFFERENTIAL/PLATELET
BASOS PCT: 1 %
Basophils Absolute: 0.1 10*3/uL (ref 0–0.1)
Eosinophils Absolute: 0.1 10*3/uL (ref 0–0.7)
Eosinophils Relative: 2 %
HCT: 38.8 % — ABNORMAL LOW (ref 40.0–52.0)
Hemoglobin: 13 g/dL (ref 13.0–18.0)
Lymphocytes Relative: 37 %
Lymphs Abs: 1.9 10*3/uL (ref 1.0–3.6)
MCH: 28.9 pg (ref 26.0–34.0)
MCHC: 33.6 g/dL (ref 32.0–36.0)
MCV: 86.1 fL (ref 80.0–100.0)
MONO ABS: 0.6 10*3/uL (ref 0.2–1.0)
Monocytes Relative: 11 %
NEUTROS PCT: 49 %
Neutro Abs: 2.5 10*3/uL (ref 1.4–6.5)
Platelets: 250 10*3/uL (ref 150–440)
RBC: 4.51 MIL/uL (ref 4.40–5.90)
RDW: 14.4 % (ref 11.5–14.5)
WBC: 5.2 10*3/uL (ref 3.8–10.6)

## 2014-12-19 MED ORDER — SODIUM CHLORIDE 0.9 % IV SOLN
10.0000 mg | Freq: Once | INTRAVENOUS | Status: AC
Start: 2014-12-19 — End: 2014-12-19
  Administered 2014-12-19: 10 mg via INTRAVENOUS
  Filled 2014-12-19: qty 1

## 2014-12-19 MED ORDER — SODIUM CHLORIDE 0.9 % IV SOLN
Freq: Once | INTRAVENOUS | Status: AC
  Administered 2014-12-19: 10:00:00 via INTRAVENOUS
  Filled 2014-12-19: qty 1000

## 2014-12-19 MED ORDER — SODIUM CHLORIDE 0.9 % IV SOLN: 375.0000 mg/m2 | Freq: Once | INTRAVENOUS | Status: DC

## 2014-12-19 MED ORDER — DIPHENHYDRAMINE HCL 50 MG/ML IJ SOLN
25.0000 mg | Freq: Once | INTRAMUSCULAR | Status: AC
  Administered 2014-12-19: 25 mg via INTRAVENOUS
  Filled 2014-12-19: qty 1

## 2014-12-19 MED ORDER — SODIUM CHLORIDE 0.9 % IV SOLN
600.0000 mg | Freq: Once | INTRAVENOUS | Status: AC
  Administered 2014-12-19: 600 mg via INTRAVENOUS
  Filled 2014-12-19: qty 50

## 2014-12-19 MED ORDER — ACETAMINOPHEN 325 MG PO TABS
650.0000 mg | ORAL_TABLET | Freq: Once | ORAL | Status: AC
  Administered 2014-12-19: 650 mg via ORAL
  Filled 2014-12-19: qty 2

## 2015-01-01 ENCOUNTER — Encounter: Payer: Self-pay | Admitting: *Deleted

## 2015-01-01 ENCOUNTER — Telehealth: Payer: Self-pay | Admitting: *Deleted

## 2015-01-01 NOTE — Telephone Encounter (Signed)
It was faxed today and I got confirmation that it go there.

## 2015-01-01 NOTE — Telephone Encounter (Signed)
Patient called triage. Patient was supposed to take a TB skin test. Patient always has a false positive. Patient needs a letter faxed to [phone] at [facility].  He wants to know if this has been sent yet.  Thank you, Nira Conn, RN

## 2015-02-06 ENCOUNTER — Other Ambulatory Visit: Payer: BLUE CROSS/BLUE SHIELD

## 2015-02-06 ENCOUNTER — Ambulatory Visit: Payer: BLUE CROSS/BLUE SHIELD

## 2015-02-06 ENCOUNTER — Ambulatory Visit: Payer: BLUE CROSS/BLUE SHIELD | Admitting: Internal Medicine

## 2015-02-13 ENCOUNTER — Inpatient Hospital Stay: Payer: BLUE CROSS/BLUE SHIELD

## 2015-02-13 ENCOUNTER — Inpatient Hospital Stay: Payer: BLUE CROSS/BLUE SHIELD | Admitting: Family Medicine

## 2015-02-19 ENCOUNTER — Inpatient Hospital Stay: Payer: BLUE CROSS/BLUE SHIELD

## 2015-02-19 ENCOUNTER — Encounter: Payer: Self-pay | Admitting: Internal Medicine

## 2015-02-19 ENCOUNTER — Inpatient Hospital Stay (HOSPITAL_BASED_OUTPATIENT_CLINIC_OR_DEPARTMENT_OTHER): Payer: BLUE CROSS/BLUE SHIELD | Admitting: Internal Medicine

## 2015-02-19 ENCOUNTER — Inpatient Hospital Stay: Payer: BLUE CROSS/BLUE SHIELD | Attending: Internal Medicine

## 2015-02-19 ENCOUNTER — Other Ambulatory Visit: Payer: Self-pay | Admitting: *Deleted

## 2015-02-19 VITALS — BP 117/77 | HR 73 | Temp 97.8°F | Resp 18 | Ht <= 58 in | Wt 142.2 lb

## 2015-02-19 DIAGNOSIS — F17210 Nicotine dependence, cigarettes, uncomplicated: Secondary | ICD-10-CM | POA: Insufficient documentation

## 2015-02-19 DIAGNOSIS — Z79899 Other long term (current) drug therapy: Secondary | ICD-10-CM | POA: Diagnosis not present

## 2015-02-19 DIAGNOSIS — Z5111 Encounter for antineoplastic chemotherapy: Secondary | ICD-10-CM | POA: Diagnosis not present

## 2015-02-19 DIAGNOSIS — C8300 Small cell B-cell lymphoma, unspecified site: Secondary | ICD-10-CM

## 2015-02-19 DIAGNOSIS — D479 Neoplasm of uncertain behavior of lymphoid, hematopoietic and related tissue, unspecified: Secondary | ICD-10-CM

## 2015-02-19 DIAGNOSIS — K219 Gastro-esophageal reflux disease without esophagitis: Secondary | ICD-10-CM | POA: Diagnosis not present

## 2015-02-19 DIAGNOSIS — N189 Chronic kidney disease, unspecified: Secondary | ICD-10-CM | POA: Diagnosis not present

## 2015-02-19 LAB — COMPREHENSIVE METABOLIC PANEL
ALK PHOS: 52 U/L (ref 38–126)
ALT: 30 U/L (ref 17–63)
ANION GAP: 8 (ref 5–15)
AST: 35 U/L (ref 15–41)
Albumin: 4.1 g/dL (ref 3.5–5.0)
BILIRUBIN TOTAL: 0.4 mg/dL (ref 0.3–1.2)
BUN: 19 mg/dL (ref 6–20)
CO2: 24 mmol/L (ref 22–32)
Calcium: 8.8 mg/dL — ABNORMAL LOW (ref 8.9–10.3)
Chloride: 107 mmol/L (ref 101–111)
Creatinine, Ser: 1.63 mg/dL — ABNORMAL HIGH (ref 0.61–1.24)
GFR, EST AFRICAN AMERICAN: 51 mL/min — AB (ref >60)
GFR, EST NON AFRICAN AMERICAN: 44 mL/min — AB (ref >60)
Glucose, Bld: 127 mg/dL — ABNORMAL HIGH (ref 65–99)
Potassium: 3.8 mmol/L (ref 3.5–5.1)
SODIUM: 139 mmol/L (ref 135–145)
TOTAL PROTEIN: 6.4 g/dL — AB (ref 6.5–8.1)

## 2015-02-19 LAB — CBC WITH DIFFERENTIAL/PLATELET
Basophils Absolute: 0 10*3/uL (ref 0–0.1)
Basophils Relative: 1 %
EOS ABS: 0.1 10*3/uL (ref 0–0.7)
Eosinophils Relative: 2 %
HEMATOCRIT: 39.1 % — AB (ref 40.0–52.0)
HEMOGLOBIN: 12.9 g/dL — AB (ref 13.0–18.0)
LYMPHS ABS: 1.5 10*3/uL (ref 1.0–3.6)
LYMPHS PCT: 25 %
MCH: 28.8 pg (ref 26.0–34.0)
MCHC: 33.1 g/dL (ref 32.0–36.0)
MCV: 86.9 fL (ref 80.0–100.0)
MONOS PCT: 9 %
Monocytes Absolute: 0.5 10*3/uL (ref 0.2–1.0)
NEUTROS PCT: 63 %
Neutro Abs: 3.7 10*3/uL (ref 1.4–6.5)
Platelets: 261 10*3/uL (ref 150–440)
RBC: 4.5 MIL/uL (ref 4.40–5.90)
RDW: 14.9 % — ABNORMAL HIGH (ref 11.5–14.5)
WBC: 5.9 10*3/uL (ref 3.8–10.6)

## 2015-02-19 LAB — LACTATE DEHYDROGENASE: LDH: 146 U/L (ref 98–192)

## 2015-02-19 MED ORDER — ACETAMINOPHEN 325 MG PO TABS
650.0000 mg | ORAL_TABLET | Freq: Once | ORAL | Status: AC
  Administered 2015-02-19: 650 mg via ORAL
  Filled 2015-02-19: qty 2

## 2015-02-19 MED ORDER — DIPHENHYDRAMINE HCL 50 MG/ML IJ SOLN
25.0000 mg | Freq: Once | INTRAMUSCULAR | Status: AC
  Administered 2015-02-19: 25 mg via INTRAVENOUS
  Filled 2015-02-19: qty 1

## 2015-02-19 MED ORDER — SODIUM CHLORIDE 0.9 % IV SOLN
10.0000 mg | Freq: Once | INTRAVENOUS | Status: AC
  Administered 2015-02-19: 10 mg via INTRAVENOUS
  Filled 2015-02-19: qty 1

## 2015-02-19 MED ORDER — SODIUM CHLORIDE 0.9 % IV SOLN
375.0000 mg/m2 | Freq: Once | INTRAVENOUS | Status: AC
  Administered 2015-02-19: 600 mg via INTRAVENOUS
  Filled 2015-02-19: qty 50

## 2015-02-19 MED ORDER — SODIUM CHLORIDE 0.9 % IV SOLN: 375.0000 mg/m2 | Freq: Once | INTRAVENOUS | Status: DC

## 2015-02-19 MED ORDER — SODIUM CHLORIDE 0.9 % IV SOLN
Freq: Once | INTRAVENOUS | Status: AC
  Administered 2015-02-19: 10:00:00 via INTRAVENOUS
  Filled 2015-02-19: qty 1000

## 2015-02-19 NOTE — Progress Notes (Signed)
AUG 2014- IgM- 0.5mg /dl; Kappa light chain restriction

## 2015-02-19 NOTE — Progress Notes (Signed)
Patient received influenza vaccine at the [facility].

## 2015-02-19 NOTE — Progress Notes (Signed)
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   # CKD [stage III creat 1.8]  INTERVAL HISTORY:  This is my first interaction with the patient since I joined the practice September 2016. I reviewed the patient's prior chart/pertinent labs/imaging in detail; findings are summarized.  Patient denies any unusual lumps or bumps. His appetite is good. He is not losing any weight. No night sweats. Denies any episodes of infection. He does feel intermittently fatigued.   REVIEW OF SYSTEMS:  A complete 10 point review of system is done which is negative except mentioned above/history of present illness.   PAST MEDICAL HISTORY :  Past Medical History  Diagnosis Date  . Non Hodgkin's lymphoma ([place])   . Chronic kidney disease   . Anemia   . Arthritis   . GERD (gastroesophageal reflux disease)     PAST SURGICAL HISTORY :   Past Surgical History  Procedure Laterality Date  . Left knee surgery  1981    ACL repair  . Left eye surgery  2012  . Bone marrow biopsy      FAMILY HISTORY :   Family History  Problem Relation Age of Onset  . Pancreatic cancer Mother 76  . Prostate cancer Brother 90  . Prostate cancer Father 43    SOCIAL HISTORY:   Social History  Substance Use Topics  . Smoking status: Current Every Day Smoker -- 0.25 packs/day for 10 years    Types: Cigarettes  . Smokeless tobacco: Never Used     Comment: "I only smoke in the bar on occasion"  . Alcohol Use: 0.0 oz/week    0 Standard drinks or equivalent per week     Comment: consumes alcohol on the weekend.    ALLERGIES:  is allergic  to no known allergies.  MEDICATIONS:  Current Outpatient Prescriptions  Medication Sig Dispense Refill  . cyclobenzaprine (FLEXERIL) 10 MG tablet Take 10 mg by mouth every 8 (eight) hours as needed.  0  . pantoprazole (PROTONIX) 40 MG tablet Take 40 mg by mouth daily.   0   No current facility-administered medications for this visit.    PHYSICAL EXAMINATION: ECOG PERFORMANCE STATUS: 0 - Asymptomatic  BP 117/77 mmHg  Pulse 73  Temp(Src) 97.8 F (36.6 C) (Tympanic)  Resp 18  Ht 4' 7.87" (1.419 m)  Wt 142 lb 3.2 oz (64.5 kg)  BMI 32.03 kg/m2  Filed Weights   02/19/15 0923  Weight: 142 lb 3.2 oz (64.5 kg)    GENERAL: Well-nourished well-developed; Alert, no distress and comfortable.   Alone. EYES: no pallor or icterus OROPHARYNX: no thrush or ulceration; good dentition  NECK: supple, no masses felt LYMPH:  no palpable lymphadenopathy in the cervical, axillary or inguinal regions LUNGS: clear to auscultation and  No wheeze or crackles HEART/CVS: regular rate & rhythm and no murmurs; No lower extremity edema ABDOMEN:abdomen soft, non-tender and normal bowel sounds Musculoskeletal:no cyanosis of digits and no clubbing  PSYCH: alert & oriented x 3 with fluent speech NEURO: no focal motor/sensory deficits SKIN:  no rashes or significant lesions  LABORATORY DATA:  I have reviewed the data as listed  Component Value Date/Time   NA 139 02/19/2015 0906   NA 139 11/08/2013 0824   K 3.8 02/19/2015 0906   K 4.3 11/08/2013 0824   CL 107 02/19/2015 0906   CL 104 11/08/2013 0824   CO2 24 02/19/2015 0906   CO2 27 11/08/2013 0824   GLUCOSE 127* 02/19/2015 0906   GLUCOSE 92 11/08/2013 0824   BUN 19 02/19/2015 0906   BUN 18 11/08/2013 0824   CREATININE 1.63* 02/19/2015 0906   CREATININE 1.85* 04/25/2014 0840   CALCIUM 8.8* 02/19/2015 0906   CALCIUM 8.8 11/08/2013 0824   PROT 6.4* 02/19/2015 0906   PROT 6.6 02/28/2014 0902   ALBUMIN 4.1 02/19/2015 0906   ALBUMIN 3.5  02/28/2014 0902   AST 35 02/19/2015 0906   AST 14* 02/28/2014 0902   ALT 30 02/19/2015 0906   ALT 19 02/28/2014 0902   ALKPHOS 52 02/19/2015 0906   ALKPHOS 53 02/28/2014 0902   BILITOT 0.4 02/19/2015 0906   BILITOT 0.3 02/28/2014 0902   GFRNONAA 44* 02/19/2015 0906   GFRNONAA 40* 04/25/2014 0840   GFRNONAA 46* 11/08/2013 0824   GFRAA 51* 02/19/2015 0906   GFRAA 48* 04/25/2014 0840   GFRAA 53* 11/08/2013 0824    No results found for: SPEP, UPEP  Lab Results  Component Value Date   WBC 5.9 02/19/2015   NEUTROABS 3.7 02/19/2015   HGB 12.9* 02/19/2015   HCT 39.1* 02/19/2015   MCV 86.9 02/19/2015   PLT 261 02/19/2015      Chemistry      Component Value Date/Time   NA 139 02/19/2015 0906   NA 139 11/08/2013 0824   K 3.8 02/19/2015 0906   K 4.3 11/08/2013 0824   CL 107 02/19/2015 0906   CL 104 11/08/2013 0824   CO2 24 02/19/2015 0906   CO2 27 11/08/2013 0824   BUN 19 02/19/2015 0906   BUN 18 11/08/2013 0824   CREATININE 1.63* 02/19/2015 0906   CREATININE 1.85* 04/25/2014 0840      Component Value Date/Time   CALCIUM 8.8* 02/19/2015 0906   CALCIUM 8.8 11/08/2013 0824   ALKPHOS 52 02/19/2015 0906   ALKPHOS 53 02/28/2014 0902   AST 35 02/19/2015 0906   AST 14* 02/28/2014 0902   ALT 30 02/19/2015 0906   ALT 19 02/28/2014 0902   BILITOT 0.4 02/19/2015 0906   BILITOT 0.3 02/28/2014 0902       RADIOGRAPHIC STUDIES: I have personally reviewed the radiological images as listed and agreed with the findings in the report. No results found.   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 Most recent CAT scan April 2016/non-contrast showed no evidence of disease.  # I recommend proceeding with Rituxan today. I also reviewed the data With Rituxan maintenance for 2 years. I would stop Rituxan after this treatment. Recommend follow-up CT scans; M protein levels  in approximately 2 months.  At length I reviewed the data for low-grade B-cell lymphoma- are usually not curable; however  they might go into remissions/ needing treatments only intermittently.  No orders of the defined types were placed in this encounter.   All questions were answered. The patient knows to call the clinic with any problems, questions or concerns. No barriers to learning was detected. I spent 25 minutes counseling the patient face to face. The total time spent in the appointment was 30 minutes and more than 50% was on counseling  and review of test results     Cammie Sickle, MD 02/19/2015 9:51 AM

## 2015-04-09 ENCOUNTER — Other Ambulatory Visit: Payer: Self-pay | Admitting: Nurse Practitioner

## 2015-04-14 ENCOUNTER — Other Ambulatory Visit: Payer: Self-pay | Admitting: *Deleted

## 2015-04-14 DIAGNOSIS — C8300 Small cell B-cell lymphoma, unspecified site: Secondary | ICD-10-CM

## 2015-04-17 ENCOUNTER — Ambulatory Visit
Admission: RE | Admit: 2015-04-17 | Discharge: 2015-04-17 | Disposition: A | Discharge status: Home / Self Care | Payer: BLUE CROSS/BLUE SHIELD | Source: Ambulatory Visit | Attending: Internal Medicine | Admitting: Internal Medicine

## 2015-04-17 ENCOUNTER — Encounter: Payer: Self-pay | Admitting: *Deleted

## 2015-04-17 DIAGNOSIS — C8300 Small cell B-cell lymphoma, unspecified site: Secondary | ICD-10-CM

## 2015-04-17 DIAGNOSIS — I2510 Atherosclerotic heart disease of native coronary artery without angina pectoris: Secondary | ICD-10-CM | POA: Diagnosis not present

## 2015-04-17 NOTE — Progress Notes (Signed)
Results reviewed by RN. Patient has an apt on 04/20/15 with Dr. Tish Men

## 2015-04-20 ENCOUNTER — Inpatient Hospital Stay: Payer: BLUE CROSS/BLUE SHIELD | Admitting: Internal Medicine

## 2015-04-20 ENCOUNTER — Inpatient Hospital Stay: Payer: BLUE CROSS/BLUE SHIELD

## 2015-04-21 ENCOUNTER — Encounter: Payer: Self-pay | Admitting: *Deleted

## 2015-04-21 NOTE — Progress Notes (Signed)
per Dr. Eulogio Ditch to inclement weather-msg sent to cancer center scheduling.  ok to r/s patient's lab/md to early next week. This apt is to review scan results.

## 2015-04-27 ENCOUNTER — Inpatient Hospital Stay: Payer: BLUE CROSS/BLUE SHIELD | Attending: Internal Medicine | Admitting: Internal Medicine

## 2015-04-27 ENCOUNTER — Inpatient Hospital Stay: Payer: BLUE CROSS/BLUE SHIELD

## 2015-04-27 ENCOUNTER — Encounter: Payer: Self-pay | Admitting: Internal Medicine

## 2015-04-27 VITALS — BP 110/70 | HR 72 | Temp 97.2°F | Ht 66.0 in | Wt 159.8 lb

## 2015-04-27 DIAGNOSIS — M1990 Unspecified osteoarthritis, unspecified site: Secondary | ICD-10-CM | POA: Diagnosis not present

## 2015-04-27 DIAGNOSIS — K219 Gastro-esophageal reflux disease without esophagitis: Secondary | ICD-10-CM | POA: Diagnosis not present

## 2015-04-27 DIAGNOSIS — Z79899 Other long term (current) drug therapy: Secondary | ICD-10-CM | POA: Diagnosis not present

## 2015-04-27 DIAGNOSIS — C8300 Small cell B-cell lymphoma, unspecified site: Secondary | ICD-10-CM

## 2015-04-27 DIAGNOSIS — F17210 Nicotine dependence, cigarettes, uncomplicated: Secondary | ICD-10-CM | POA: Insufficient documentation

## 2015-04-27 DIAGNOSIS — N181 Chronic kidney disease, stage 1: Secondary | ICD-10-CM | POA: Insufficient documentation

## 2015-04-27 LAB — CBC WITH DIFFERENTIAL/PLATELET
BASOS ABS: 0 10*3/uL (ref 0–0.1)
Basophils Relative: 1 %
EOS ABS: 0.1 10*3/uL (ref 0–0.7)
Eosinophils Relative: 2 %
HCT: 38.5 % — ABNORMAL LOW (ref 40.0–52.0)
Hemoglobin: 12.8 g/dL — ABNORMAL LOW (ref 13.0–18.0)
LYMPHS ABS: 1.3 10*3/uL (ref 1.0–3.6)
LYMPHS PCT: 27 %
MCH: 28.6 pg (ref 26.0–34.0)
MCHC: 33.3 g/dL (ref 32.0–36.0)
MCV: 86 fL (ref 80.0–100.0)
MONOS PCT: 12 %
Monocytes Absolute: 0.6 10*3/uL (ref 0.2–1.0)
NEUTROS PCT: 58 %
Neutro Abs: 2.8 10*3/uL (ref 1.4–6.5)
PLATELETS: 244 10*3/uL (ref 150–440)
RBC: 4.47 MIL/uL (ref 4.40–5.90)
RDW: 15.7 % — AB (ref 11.5–14.5)
WBC: 4.8 10*3/uL (ref 3.8–10.6)

## 2015-04-27 LAB — COMPREHENSIVE METABOLIC PANEL
ALT: 19 U/L (ref 17–63)
ANION GAP: 7 (ref 5–15)
AST: 19 U/L (ref 15–41)
Albumin: 3.7 g/dL (ref 3.5–5.0)
Alkaline Phosphatase: 45 U/L (ref 38–126)
BILIRUBIN TOTAL: 0.6 mg/dL (ref 0.3–1.2)
BUN: 18 mg/dL (ref 6–20)
CHLORIDE: 108 mmol/L (ref 101–111)
CO2: 24 mmol/L (ref 22–32)
Calcium: 9 mg/dL (ref 8.9–10.3)
Creatinine, Ser: 1.76 mg/dL — ABNORMAL HIGH (ref 0.61–1.24)
GFR, EST AFRICAN AMERICAN: 47 mL/min — AB (ref >60)
GFR, EST NON AFRICAN AMERICAN: 40 mL/min — AB (ref >60)
Glucose, Bld: 97 mg/dL (ref 65–99)
POTASSIUM: 3.6 mmol/L (ref 3.5–5.1)
Sodium: 139 mmol/L (ref 135–145)
TOTAL PROTEIN: 6.4 g/dL — AB (ref 6.5–8.1)

## 2015-04-27 NOTE — Progress Notes (Signed)
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 CT [non-contrast]- January 2017- NED  # CKD [stage III creat 1.8]  INTERVAL HISTORY:  A very pleasant 60 year old male Douglas with above history of stage IV lymphoplasmacytic lymphoma/marginal zone lymphoma currently status post maintenance rituximab [last treatment November 2016]; is here to review the results of his restaging CAT scan.  Douglas denies any unusual lumps or bumps. His appetite is good. He is not losing any weight. No night sweats. Denies any episodes of infection. Denies any unusual fatigue.   Douglas complains of pain in his right shoulder/upper back; has been on Flexeril. Not helping.  REVIEW OF SYSTEMS:  A complete 10 point review of system is done which is negative except mentioned above/history of present illness.   PAST MEDICAL HISTORY :  Past Medical History  Diagnosis Date  . Non Hodgkin's lymphoma ([place])   . Chronic kidney disease   . Anemia   . Arthritis   . GERD (gastroesophageal reflux disease)     PAST SURGICAL HISTORY :   Past Surgical History  Procedure Laterality Date  . Left knee surgery  1981    ACL repair  . Left eye surgery  2012  . Bone marrow biopsy      FAMILY HISTORY :   Family History  Problem Relation Age of Onset  . Pancreatic cancer Mother 24  . Prostate cancer Brother 30  . Prostate cancer Father 20    SOCIAL HISTORY:   Social History  Substance Use Topics  . Smoking status: Current Every Day Smoker -- 0.25 packs/day for 10 years    Types: Cigarettes  . Smokeless tobacco:  Never Used     Comment: "I only smoke in the bar on occasion"  . Alcohol Use: 0.0 oz/week    0 Standard drinks or equivalent per week     Comment: consumes alcohol on the weekend.    ALLERGIES:  is allergic to no known allergies.  MEDICATIONS:  Current Outpatient Prescriptions  Medication Sig Dispense Refill  . pantoprazole (PROTONIX) 40 MG tablet Take 40 mg by mouth daily.   0   No current facility-administered medications for this visit.    PHYSICAL EXAMINATION: ECOG PERFORMANCE STATUS: 0 - Asymptomatic  BP 110/70 mmHg  Pulse 72  Temp(Src) 97.2 F (36.2 C) (Tympanic)  Ht '5\' 6"'  (1.676 m)  Wt 159 lb 13.3 oz (72.5 kg)  BMI 25.81 kg/m2  Filed Weights   04/27/15 1024  Weight: 159 lb 13.3 oz (72.5 kg)    GENERAL: Well-nourished well-developed; Alert, no distress and comfortable. Accompanied by his wife. EYES: no pallor or icterus OROPHARYNX: no thrush or ulceration; good dentition  NECK: supple, no masses felt LYMPH:  no palpable lymphadenopathy in the cervical, axillary or inguinal regions LUNGS: clear to auscultation and  No wheeze or crackles HEART/CVS: regular rate & rhythm and no murmurs; No lower extremity edema ABDOMEN:abdomen soft, non-tender and normal bowel sounds Musculoskeletal:no cyanosis of digits and no clubbing  PSYCH: alert & oriented x 3 with fluent speech NEURO: no focal motor/sensory deficits SKIN:  no rashes or significant lesions  LABORATORY DATA:  I have reviewed the data as listed    Component Value Date/Time   NA 139 04/27/2015 1016   NA 139 11/08/2013 0824   K 3.6 04/27/2015 1016   K 4.3 11/08/2013 0824   CL 108 04/27/2015 1016   CL 104 11/08/2013 0824   CO2 24 04/27/2015 1016   CO2 27 11/08/2013 0824   GLUCOSE 97 04/27/2015 1016   GLUCOSE 92 11/08/2013 0824   BUN 18 04/27/2015 1016   BUN 18 11/08/2013 0824   CREATININE 1.76* 04/27/2015 1016   CREATININE 1.85* 04/25/2014 0840   CALCIUM 9.0 04/27/2015 1016   CALCIUM 8.8 11/08/2013  0824   PROT 6.4* 04/27/2015 1016   PROT 6.6 02/28/2014 0902   ALBUMIN 3.7 04/27/2015 1016   ALBUMIN 3.5 02/28/2014 0902   AST 19 04/27/2015 1016   AST 14* 02/28/2014 0902   ALT 19 04/27/2015 1016   ALT 19 02/28/2014 0902   ALKPHOS 45 04/27/2015 1016   ALKPHOS 53 02/28/2014 0902   BILITOT 0.6 04/27/2015 1016   BILITOT 0.3 02/28/2014 0902   GFRNONAA 40* 04/27/2015 1016   GFRNONAA 40* 04/25/2014 0840   GFRNONAA Terry* 11/08/2013 0824   GFRAA 47* 04/27/2015 1016   GFRAA 48* 04/25/2014 0840   GFRAA 53* 11/08/2013 0824    No results found for: SPEP, UPEP  Lab Results  Component Value Date   WBC 4.8 04/27/2015   NEUTROABS 2.8 04/27/2015   HGB 12.8* 04/27/2015   HCT 38.5* 04/27/2015   MCV 86.0 04/27/2015   PLT 244 04/27/2015      Chemistry      Component Value Date/Time   NA 139 04/27/2015 1016   NA 139 11/08/2013 0824   K 3.6 04/27/2015 1016   K 4.3 11/08/2013 0824   CL 108 04/27/2015 1016   CL 104 11/08/2013 0824   CO2 24 04/27/2015 1016   CO2 27 11/08/2013 0824   BUN 18 04/27/2015 1016   BUN 18 11/08/2013 0824   CREATININE 1.76* 04/27/2015 1016   CREATININE 1.85* 04/25/2014 0840      Component Value Date/Time   CALCIUM 9.0 04/27/2015 1016   CALCIUM 8.8 11/08/2013 0824   ALKPHOS 45 04/27/2015 1016   ALKPHOS 53 02/28/2014 0902   AST 19 04/27/2015 1016   AST 14* 02/28/2014 0902   ALT 19 04/27/2015 1016   ALT 19 02/28/2014 0902   BILITOT 0.6 04/27/2015 1016   BILITOT 0.3 02/28/2014 0902       RADIOGRAPHIC STUDIES: I have personally reviewed the radiological images as listed and agreed with the findings in the report. No results found.   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 CT scan in January 2017/non-contrast showed no evidence of disease.  I again, I reviewed the data for low-grade  B-cell lymphoma- are usually not curable; however  they might go into remissions/ needing treatments only intermittently. Today her white count is 5.9; hemoglobin slightly low at 12.8 platelets normal limits. Monoclonal protein workup pending.   # shoulder pain/upper back pain likely arthritic; not related to lymphoma. Given this mildly CKD; I would not recommend NSAIDs. Recommend Tylenol or other pain medication. I would defer pain management to his PCP.  # for now I would recommend follow-up every 4 months or so; CBC CMP; monoclonal workup.   # I reviewed the images of the CAT scan myself; I reviewed the  report with the Douglas. He was given a copy of the CAT scan.  I spent 25 minutes counseling the Douglas face to face. The total time spent in the appointment was 30 minutes and more than 50% was on counseling and review of test results     Cammie Sickle, MD 04/27/2015 10:44 AM

## 2015-04-28 LAB — MULTIPLE MYELOMA PANEL, SERUM
ALBUMIN/GLOB SERPL: 1.6 (ref 0.7–1.7)
ALPHA2 GLOB SERPL ELPH-MCNC: 0.5 g/dL (ref 0.4–1.0)
Albumin SerPl Elph-Mcnc: 3.5 g/dL (ref 2.9–4.4)
Alpha 1: 0.2 g/dL (ref 0.0–0.4)
B-GLOBULIN SERPL ELPH-MCNC: 0.8 g/dL (ref 0.7–1.3)
GAMMA GLOB SERPL ELPH-MCNC: 0.8 g/dL (ref 0.4–1.8)
Globulin, Total: 2.3 g/dL (ref 2.2–3.9)
IgA: 76 mg/dL — ABNORMAL LOW (ref 90–386)
IgG (Immunoglobin G), Serum: 502 mg/dL — ABNORMAL LOW (ref 700–1600)
IgM, Serum: 344 mg/dL — ABNORMAL HIGH (ref 20–172)
M Protein SerPl Elph-Mcnc: 0.3 g/dL — ABNORMAL HIGH
Total Protein ELP: 5.8 g/dL — ABNORMAL LOW (ref 6.0–8.5)

## 2015-04-28 LAB — IMMUNOFIXATION ELECTROPHORESIS
IGG (IMMUNOGLOBIN G), SERUM: 475 mg/dL — AB (ref 700–1600)
IgA: 80 mg/dL — ABNORMAL LOW (ref 90–386)
IgM, Serum: 348 mg/dL — ABNORMAL HIGH (ref 20–172)
TOTAL PROTEIN ELP: 6 g/dL (ref 6.0–8.5)

## 2015-08-26 ENCOUNTER — Inpatient Hospital Stay: Payer: BLUE CROSS/BLUE SHIELD

## 2015-08-26 ENCOUNTER — Inpatient Hospital Stay: Payer: BLUE CROSS/BLUE SHIELD | Admitting: Internal Medicine

## 2015-08-28 ENCOUNTER — Inpatient Hospital Stay: Payer: BLUE CROSS/BLUE SHIELD

## 2015-08-28 ENCOUNTER — Inpatient Hospital Stay: Payer: BLUE CROSS/BLUE SHIELD | Attending: Internal Medicine | Admitting: Internal Medicine

## 2015-08-28 VITALS — BP 121/80 | HR 66 | Temp 97.6°F | Resp 18 | Wt 163.6 lb

## 2015-08-28 DIAGNOSIS — N189 Chronic kidney disease, unspecified: Secondary | ICD-10-CM | POA: Insufficient documentation

## 2015-08-28 DIAGNOSIS — F17210 Nicotine dependence, cigarettes, uncomplicated: Secondary | ICD-10-CM | POA: Diagnosis not present

## 2015-08-28 DIAGNOSIS — K219 Gastro-esophageal reflux disease without esophagitis: Secondary | ICD-10-CM | POA: Insufficient documentation

## 2015-08-28 DIAGNOSIS — Z8572 Personal history of non-Hodgkin lymphomas: Secondary | ICD-10-CM | POA: Insufficient documentation

## 2015-08-28 DIAGNOSIS — C8300 Small cell B-cell lymphoma, unspecified site: Secondary | ICD-10-CM

## 2015-08-28 DIAGNOSIS — M1990 Unspecified osteoarthritis, unspecified site: Secondary | ICD-10-CM | POA: Diagnosis not present

## 2015-08-28 DIAGNOSIS — Z79899 Other long term (current) drug therapy: Secondary | ICD-10-CM | POA: Diagnosis not present

## 2015-08-28 LAB — COMPREHENSIVE METABOLIC PANEL
ALBUMIN: 4.3 g/dL (ref 3.5–5.0)
ALT: 24 U/L (ref 17–63)
AST: 25 U/L (ref 15–41)
Alkaline Phosphatase: 50 U/L (ref 38–126)
Anion gap: 7 (ref 5–15)
BUN: 25 mg/dL — AB (ref 6–20)
CHLORIDE: 106 mmol/L (ref 101–111)
CO2: 25 mmol/L (ref 22–32)
CREATININE: 1.48 mg/dL — AB (ref 0.61–1.24)
Calcium: 9 mg/dL (ref 8.9–10.3)
GFR calc non Af Amer: 50 mL/min — ABNORMAL LOW (ref >60)
GFR, EST AFRICAN AMERICAN: 58 mL/min — AB (ref >60)
GLUCOSE: 97 mg/dL (ref 65–99)
Potassium: 3.7 mmol/L (ref 3.5–5.1)
SODIUM: 138 mmol/L (ref 135–145)
Total Bilirubin: 0.7 mg/dL (ref 0.3–1.2)
Total Protein: 7.1 g/dL (ref 6.5–8.1)

## 2015-08-28 LAB — CBC WITH DIFFERENTIAL/PLATELET
BASOS ABS: 0.1 10*3/uL (ref 0–0.1)
BASOS PCT: 1 %
EOS ABS: 0.1 10*3/uL (ref 0–0.7)
EOS PCT: 1 %
HCT: 37.4 % — ABNORMAL LOW (ref 40.0–52.0)
HEMOGLOBIN: 12.7 g/dL — AB (ref 13.0–18.0)
Lymphocytes Relative: 36 %
Lymphs Abs: 2.3 10*3/uL (ref 1.0–3.6)
MCH: 29.4 pg (ref 26.0–34.0)
MCHC: 34 g/dL (ref 32.0–36.0)
MCV: 86.3 fL (ref 80.0–100.0)
Monocytes Absolute: 0.7 10*3/uL (ref 0.2–1.0)
Monocytes Relative: 11 %
NEUTROS PCT: 51 %
Neutro Abs: 3.4 10*3/uL (ref 1.4–6.5)
PLATELETS: 247 10*3/uL (ref 150–440)
RBC: 4.33 MIL/uL — AB (ref 4.40–5.90)
RDW: 15.3 % — ABNORMAL HIGH (ref 11.5–14.5)
WBC: 6.6 10*3/uL (ref 3.8–10.6)

## 2015-08-28 NOTE — Progress Notes (Signed)
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 CT [non-contrast]- January 2017- NED  # CKD [stage III creat 1.8]  INTERVAL HISTORY:  A very pleasant 60 year old male patient with above history of stage IV lymphoplasmacytic lymphoma/marginal zone lymphoma currently status post maintenance rituximab [last treatment November 2016].  Appetite is good. No weight loss no night sweats no fevers. No lumps or bumps. No unusual fatigue.no chest pain or shortness of breath or cough.   REVIEW OF SYSTEMS:  A complete 10 point review of system is done which is negative except mentioned above/history of present illness.   PAST MEDICAL HISTORY :  Past Medical History  Diagnosis Date  . Non Hodgkin's lymphoma ([place])   . Chronic kidney disease   . Anemia   . Arthritis   . GERD (gastroesophageal reflux disease)     PAST SURGICAL HISTORY :   Past Surgical History  Procedure Laterality Date  . Left knee surgery  1981    ACL repair  . Left eye surgery  2012  . Bone marrow biopsy      FAMILY HISTORY :   Family History  Problem Relation Age of Onset  . Pancreatic cancer Mother 39  . Prostate cancer Brother 13  . Prostate cancer Father 46    SOCIAL HISTORY:   Social History  Substance Use Topics  . Smoking status: Current Every Day Smoker -- 0.25 packs/day for 10 years    Types: Cigarettes  . Smokeless tobacco: Never Used     Comment: "I only smoke in the bar on occasion"  . Alcohol Use: 0.0 oz/week    0 Standard drinks or equivalent per week     Comment: consumes alcohol on the weekend.     ALLERGIES:  is allergic to no known allergies.  MEDICATIONS:  Current Outpatient Prescriptions  Medication Sig Dispense Refill  . pantoprazole (PROTONIX) 40 MG tablet Take 40 mg by mouth daily.   0   No current facility-administered medications for this visit.    PHYSICAL EXAMINATION: ECOG PERFORMANCE STATUS: 0 - Asymptomatic  BP 121/80 mmHg  Pulse 66  Temp(Src) 97.6 F (36.4 C) (Tympanic)  Resp 18  Wt 163 lb 9.3 oz (74.2 kg)  Filed Weights   08/28/15 1418  Weight: 163 lb 9.3 oz (74.2 kg)    GENERAL: Well-nourished well-developed; Alert, no distress and comfortable. He is alone.  EYES: no pallor or icterus OROPHARYNX: no thrush or ulceration; good dentition  NECK: supple, no masses felt LYMPH:  no palpable lymphadenopathy in the cervical, axillary or inguinal regions LUNGS: clear to auscultation and  No wheeze or crackles HEART/CVS: regular rate & rhythm and no murmurs; No lower extremity edema ABDOMEN:abdomen soft, non-tender and normal bowel sounds Musculoskeletal:no cyanosis of digits and no clubbing  PSYCH: alert & oriented x 3 with fluent speech NEURO: no focal motor/sensory deficits SKIN:  no rashes or significant lesions  LABORATORY DATA:  I have reviewed the data as listed    Component Value Date/Time   NA 138 08/28/2015 1400   NA 139 11/08/2013 0824   K 3.7 08/28/2015 1400   K 4.3 11/08/2013  0824   CL 106 08/28/2015 1400   CL 104 11/08/2013 0824   CO2 25 08/28/2015 1400   CO2 27 11/08/2013 0824   GLUCOSE 97 08/28/2015 1400   GLUCOSE 92 11/08/2013 0824   BUN 25* 08/28/2015 1400   BUN 18 11/08/2013 0824   CREATININE 1.48* 08/28/2015 1400   CREATININE 1.85* 04/25/2014 0840   CALCIUM 9.0 08/28/2015 1400   CALCIUM 8.8 11/08/2013 0824   PROT 7.1 08/28/2015 1400   PROT 6.6 02/28/2014 0902   ALBUMIN 4.3 08/28/2015 1400   ALBUMIN 3.5 02/28/2014 0902   AST 25 08/28/2015 1400   AST 14* 02/28/2014 0902   ALT 24 08/28/2015 1400   ALT 19 02/28/2014  0902   ALKPHOS 50 08/28/2015 1400   ALKPHOS 53 02/28/2014 0902   BILITOT 0.7 08/28/2015 1400   BILITOT 0.3 02/28/2014 0902   GFRNONAA 50* 08/28/2015 1400   GFRNONAA 40* 04/25/2014 0840   GFRNONAA 46* 11/08/2013 0824   GFRAA 58* 08/28/2015 1400   GFRAA 48* 04/25/2014 0840   GFRAA 53* 11/08/2013 0824    No results found for: SPEP, UPEP  Lab Results  Component Value Date   WBC 6.6 08/28/2015   NEUTROABS 3.4 08/28/2015   HGB 12.7* 08/28/2015   HCT 37.4* 08/28/2015   MCV 86.3 08/28/2015   PLT 247 08/28/2015      Chemistry      Component Value Date/Time   NA 138 08/28/2015 1400   NA 139 11/08/2013 0824   K 3.7 08/28/2015 1400   K 4.3 11/08/2013 0824   CL 106 08/28/2015 1400   CL 104 11/08/2013 0824   CO2 25 08/28/2015 1400   CO2 27 11/08/2013 0824   BUN 25* 08/28/2015 1400   BUN 18 11/08/2013 0824   CREATININE 1.48* 08/28/2015 1400   CREATININE 1.85* 04/25/2014 0840      Component Value Date/Time   CALCIUM 9.0 08/28/2015 1400   CALCIUM 8.8 11/08/2013 0824   ALKPHOS 50 08/28/2015 1400   ALKPHOS 53 02/28/2014 0902   AST 25 08/28/2015 1400   AST 14* 02/28/2014 0902   ALT 24 08/28/2015 1400   ALT 19 02/28/2014 0902   BILITOT 0.7 08/28/2015 1400   BILITOT 0.3 02/28/2014 0902       ASSESSMENT & PLAN:   # Stage IV low-grade lymphoma [lymphoma plasmacytic versus marginal zone lymphoma]- status post maintenance Rituxan finished November 2016. CT scan in January 2017/non-contrast showed no evidence of disease.  # Clinically no evidence of recurrence.  Hemoglobin slightly low at 12.7/ follow-up in 6 months. Monoclonal workup is pending.  # CKD- creatinine- 1.48/stable-improved.   # follow-up with me in 6 months with labs.     Cammie Sickle, MD 08/28/2015 2:47 PM

## 2015-09-01 LAB — MULTIPLE MYELOMA PANEL, SERUM
Albumin SerPl Elph-Mcnc: 3.9 g/dL (ref 2.9–4.4)
Albumin/Glob SerPl: 1.7 (ref 0.7–1.7)
Alpha 1: 0.2 g/dL (ref 0.0–0.4)
Alpha2 Glob SerPl Elph-Mcnc: 0.5 g/dL (ref 0.4–1.0)
B-Globulin SerPl Elph-Mcnc: 0.9 g/dL (ref 0.7–1.3)
Gamma Glob SerPl Elph-Mcnc: 0.8 g/dL (ref 0.4–1.8)
Globulin, Total: 2.4 g/dL (ref 2.2–3.9)
IgA: 106 mg/dL (ref 90–386)
IgG (Immunoglobin G), Serum: 592 mg/dL — ABNORMAL LOW (ref 700–1600)
IgM, Serum: 292 mg/dL — ABNORMAL HIGH (ref 20–172)
M Protein SerPl Elph-Mcnc: 0.2 g/dL — ABNORMAL HIGH
Total Protein ELP: 6.3 g/dL (ref 6.0–8.5)

## 2015-09-01 LAB — IMMUNOFIXATION ELECTROPHORESIS
IgA: 107 mg/dL (ref 90–386)
IgG (Immunoglobin G), Serum: 560 mg/dL — ABNORMAL LOW (ref 700–1600)
IgM, Serum: 275 mg/dL — ABNORMAL HIGH (ref 20–172)
Total Protein ELP: 6.5 g/dL (ref 6.0–8.5)

## 2015-09-17 ENCOUNTER — Other Ambulatory Visit: Payer: BLUE CROSS/BLUE SHIELD

## 2015-09-17 ENCOUNTER — Ambulatory Visit: Payer: BLUE CROSS/BLUE SHIELD | Admitting: Internal Medicine

## 2016-03-07 ENCOUNTER — Inpatient Hospital Stay: Payer: BLUE CROSS/BLUE SHIELD | Attending: Internal Medicine | Admitting: Internal Medicine

## 2016-03-07 ENCOUNTER — Inpatient Hospital Stay: Payer: BLUE CROSS/BLUE SHIELD

## 2016-03-07 VITALS — BP 118/72 | HR 83 | Temp 97.4°F | Resp 18 | Wt 162.0 lb

## 2016-03-07 DIAGNOSIS — C8300 Small cell B-cell lymphoma, unspecified site: Secondary | ICD-10-CM

## 2016-03-07 DIAGNOSIS — Z9221 Personal history of antineoplastic chemotherapy: Secondary | ICD-10-CM | POA: Diagnosis not present

## 2016-03-07 DIAGNOSIS — F17210 Nicotine dependence, cigarettes, uncomplicated: Secondary | ICD-10-CM | POA: Diagnosis not present

## 2016-03-07 DIAGNOSIS — N183 Chronic kidney disease, stage 3 (moderate): Secondary | ICD-10-CM | POA: Insufficient documentation

## 2016-03-07 DIAGNOSIS — Z8572 Personal history of non-Hodgkin lymphomas: Secondary | ICD-10-CM | POA: Insufficient documentation

## 2016-03-07 DIAGNOSIS — Z79899 Other long term (current) drug therapy: Secondary | ICD-10-CM | POA: Insufficient documentation

## 2016-03-07 DIAGNOSIS — M1990 Unspecified osteoarthritis, unspecified site: Secondary | ICD-10-CM | POA: Diagnosis not present

## 2016-03-07 DIAGNOSIS — K219 Gastro-esophageal reflux disease without esophagitis: Secondary | ICD-10-CM | POA: Diagnosis not present

## 2016-03-07 LAB — COMPREHENSIVE METABOLIC PANEL
ALBUMIN: 4.3 g/dL (ref 3.5–5.0)
ALK PHOS: 45 U/L (ref 38–126)
ALT: 15 U/L — ABNORMAL LOW (ref 17–63)
ANION GAP: 8 (ref 5–15)
AST: 19 U/L (ref 15–41)
BILIRUBIN TOTAL: 0.5 mg/dL (ref 0.3–1.2)
BUN: 20 mg/dL (ref 6–20)
CALCIUM: 9.2 mg/dL (ref 8.9–10.3)
CO2: 24 mmol/L (ref 22–32)
Chloride: 108 mmol/L (ref 101–111)
Creatinine, Ser: 1.54 mg/dL — ABNORMAL HIGH (ref 0.61–1.24)
GFR calc Af Amer: 54 mL/min — ABNORMAL LOW (ref >60)
GFR, EST NON AFRICAN AMERICAN: 47 mL/min — AB (ref >60)
GLUCOSE: 117 mg/dL — AB (ref 65–99)
POTASSIUM: 3.6 mmol/L (ref 3.5–5.1)
Sodium: 140 mmol/L (ref 135–145)
TOTAL PROTEIN: 7.3 g/dL (ref 6.5–8.1)

## 2016-03-07 LAB — CBC WITH DIFFERENTIAL/PLATELET
Basophils Absolute: 0.1 10*3/uL (ref 0–0.1)
Basophils Relative: 1 %
Eosinophils Absolute: 0.1 10*3/uL (ref 0–0.7)
Eosinophils Relative: 1 %
HEMATOCRIT: 39.7 % — AB (ref 40.0–52.0)
HEMOGLOBIN: 13.3 g/dL (ref 13.0–18.0)
LYMPHS ABS: 2 10*3/uL (ref 1.0–3.6)
LYMPHS PCT: 28 %
MCH: 28.9 pg (ref 26.0–34.0)
MCHC: 33.4 g/dL (ref 32.0–36.0)
MCV: 86.5 fL (ref 80.0–100.0)
MONO ABS: 0.5 10*3/uL (ref 0.2–1.0)
MONOS PCT: 7 %
NEUTROS ABS: 4.6 10*3/uL (ref 1.4–6.5)
NEUTROS PCT: 63 %
Platelets: 256 10*3/uL (ref 150–440)
RBC: 4.59 MIL/uL (ref 4.40–5.90)
RDW: 15.1 % — AB (ref 11.5–14.5)
WBC: 7.3 10*3/uL (ref 3.8–10.6)

## 2016-03-07 NOTE — Progress Notes (Signed)
Patient is here for follow up, he is doing well no complaints  

## 2016-03-07 NOTE — Progress Notes (Signed)
[place] OFFICE PROGRESS NOTE  Patient Care Team: No Pcp Per Patient as PCP - General (General Practice)   SUMMARY OF ONCOLOGIC HISTORY:  . 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 CT [non-contrast]- January 2017- NED  # CKD [stage III creat 1.8]     Malignant lymphoma, lymphoplasmacytic ([place])     INTERVAL HISTORY:  A very pleasant 60 year old male patient with above history of stage IV lymphoplasmacytic lymphoma/marginal zone lymphoma currently status post maintenance rituximab [last treatment November 2016].  Patient continues to work full-time. Appetite is good. No weight loss no night sweats no fevers. No lumps or bumps. No unusual fatigue.no chest pain or shortness of breath or cough. No recent infections. No hospitalizations.    REVIEW OF SYSTEMS:  A complete 10 point review of system is done which is negative except mentioned above/history of present illness.   PAST MEDICAL HISTORY :  Past Medical History:  Diagnosis Date  . Anemia   . Arthritis   . Chronic kidney disease   . GERD (gastroesophageal reflux disease)   . Non Hodgkin's lymphoma ([place])     PAST SURGICAL HISTORY :   Past Surgical History:  Procedure Laterality Date  . BONE MARROW BIOPSY    . Left eye surgery  2012  . Left knee surgery  1981   ACL repair    FAMILY HISTORY :   Family History  Problem Relation Age of Onset  . Pancreatic cancer Mother 36  . Prostate cancer Brother 63  . Prostate cancer Father 31    SOCIAL HISTORY:   Social History  Substance Use Topics  . Smoking status: Current Every Day Smoker    Packs/day: 0.25    Years: 10.00    Types: Cigarettes  . Smokeless tobacco: Never Used   Comment: "I only smoke in the bar on occasion"  . Alcohol use 0.0 oz/week     Comment: consumes alcohol on the weekend.    ALLERGIES:  is allergic to no known allergies.  MEDICATIONS:  Current Outpatient Prescriptions  Medication Sig Dispense Refill  . pantoprazole (PROTONIX) 40 MG tablet Take 40 mg by mouth daily.   0   No current facility-administered medications for this visit.     PHYSICAL EXAMINATION: ECOG PERFORMANCE STATUS: 0 - Asymptomatic  BP 118/72 (BP Location: Left Arm, Patient Position: Sitting)   Pulse 83   Temp 97.4 F (36.3 C) (Tympanic)   Resp 18   Wt 162 lb (73.5 kg)   BMI 26.15 kg/m   Filed Weights   03/07/16 1503  Weight: 162 lb (73.5 kg)    GENERAL: Well-nourished well-developed; Alert, no distress and comfortable. He is alone.  EYES: no pallor or icterus OROPHARYNX: no thrush or ulceration; good dentition  NECK: supple, no masses felt LYMPH:  no palpable lymphadenopathy in the cervical, axillary or inguinal regions LUNGS: clear to auscultation and  No wheeze or crackles HEART/CVS: regular rate & rhythm and no murmurs; No lower extremity edema ABDOMEN:abdomen soft, non-tender and normal bowel sounds Musculoskeletal:no cyanosis of digits and no clubbing  PSYCH: alert & oriented x 3 with fluent speech NEURO: no focal motor/sensory deficits SKIN:  no rashes or significant lesions  LABORATORY DATA:  I have reviewed the data as  listed    Component Value Date/Time   NA 140 03/07/2016 1443   NA 139 11/08/2013 0824   K 3.6 03/07/2016 1443   K 4.3 11/08/2013 0824   CL 108 03/07/2016 1443   CL 104 11/08/2013 0824   CO2 24 03/07/2016 1443   CO2 27 11/08/2013 0824   GLUCOSE 117 (H) 03/07/2016 1443   GLUCOSE 92 11/08/2013 0824   BUN 20 03/07/2016 1443   BUN 18 11/08/2013 0824   CREATININE 1.54 (H) 03/07/2016 1443   CREATININE 1.85 (H) 04/25/2014 0840   CALCIUM 9.2 03/07/2016 1443   CALCIUM 8.8 11/08/2013 0824   PROT 7.3 03/07/2016 1443   PROT  6.6 02/28/2014 0902   ALBUMIN 4.3 03/07/2016 1443   ALBUMIN 3.5 02/28/2014 0902   AST 19 03/07/2016 1443   AST 14 (L) 02/28/2014 0902   ALT 15 (L) 03/07/2016 1443   ALT 19 02/28/2014 0902   ALKPHOS 45 03/07/2016 1443   ALKPHOS 53 02/28/2014 0902   BILITOT 0.5 03/07/2016 1443   BILITOT 0.3 02/28/2014 0902   GFRNONAA 47 (L) 03/07/2016 1443   GFRNONAA 40 (L) 04/25/2014 0840   GFRNONAA 46 (L) 11/08/2013 0824   GFRAA 54 (L) 03/07/2016 1443   GFRAA 48 (L) 04/25/2014 0840   GFRAA 53 (L) 11/08/2013 0824    No results found for: SPEP, UPEP  Lab Results  Component Value Date   WBC 7.3 03/07/2016   NEUTROABS 4.6 03/07/2016   HGB 13.3 03/07/2016   HCT 39.7 (L) 03/07/2016   MCV 86.5 03/07/2016   PLT 256 03/07/2016      Chemistry      Component Value Date/Time   NA 140 03/07/2016 1443   NA 139 11/08/2013 0824   K 3.6 03/07/2016 1443   K 4.3 11/08/2013 0824   CL 108 03/07/2016 1443   CL 104 11/08/2013 0824   CO2 24 03/07/2016 1443   CO2 27 11/08/2013 0824   BUN 20 03/07/2016 1443   BUN 18 11/08/2013 0824   CREATININE 1.54 (H) 03/07/2016 1443   CREATININE 1.85 (H) 04/25/2014 0840      Component Value Date/Time   CALCIUM 9.2 03/07/2016 1443   CALCIUM 8.8 11/08/2013 0824   ALKPHOS 45 03/07/2016 1443   ALKPHOS 53 02/28/2014 0902   AST 19 03/07/2016 1443   AST 14 (L) 02/28/2014 0902   ALT 15 (L) 03/07/2016 1443   ALT 19 02/28/2014 0902   BILITOT 0.5 03/07/2016 1443   BILITOT 0.3 02/28/2014 0902       ASSESSMENT & PLAN:   Malignant lymphoma, lymphoplasmacytic ([place]) # Stage IV low-grade lymphoma [lymphoma plasmacytic versus marginal zone lymphoma]- status post maintenance Rituxan finished November 2016. CT scan in January 2017/non-contrast showed no evidence of disease.  # Clinically no evidence of recurrence.  Hemoglobin slightly low at 13/ follow-up in 6 months. Monoclonal workup is pending.  # CKD- creatinine- 1.54/stable-improved.   # follow-up with me in 6 months  with labs/ 1 week prior.     Cammie Sickle, MD 03/08/2016 10:00 AM

## 2016-03-07 NOTE — Assessment & Plan Note (Addendum)
#   Stage IV low-grade lymphoma [lymphoma plasmacytic versus marginal zone lymphoma]- status post maintenance Rituxan finished November 2016. CT scan in January 2017/non-contrast showed no evidence of disease.  # Clinically no evidence of recurrence.  Hemoglobin slightly low at 13/ follow-up in 6 months. Monoclonal workup is pending.  # CKD- creatinine- 1.54/stable-improved.   # follow-up with me in 6 months with labs/ 1 week prior.

## 2016-03-08 LAB — MULTIPLE MYELOMA PANEL, SERUM
ALBUMIN/GLOB SERPL: 1.7 (ref 0.7–1.7)
Albumin SerPl Elph-Mcnc: 4 g/dL (ref 2.9–4.4)
Alpha 1: 0.2 g/dL (ref 0.0–0.4)
Alpha2 Glob SerPl Elph-Mcnc: 0.6 g/dL (ref 0.4–1.0)
B-Globulin SerPl Elph-Mcnc: 0.9 g/dL (ref 0.7–1.3)
GAMMA GLOB SERPL ELPH-MCNC: 0.8 g/dL (ref 0.4–1.8)
GLOBULIN, TOTAL: 2.5 g/dL (ref 2.2–3.9)
IGA: 144 mg/dL (ref 61–437)
IgG (Immunoglobin G), Serum: 681 mg/dL — ABNORMAL LOW (ref 700–1600)
IgM, Serum: 285 mg/dL — ABNORMAL HIGH (ref 20–172)
M Protein SerPl Elph-Mcnc: 0.2 g/dL — ABNORMAL HIGH
Total Protein ELP: 6.5 g/dL (ref 6.0–8.5)

## 2016-08-31 ENCOUNTER — Inpatient Hospital Stay: Payer: BLUE CROSS/BLUE SHIELD

## 2016-09-01 ENCOUNTER — Inpatient Hospital Stay: Payer: BLUE CROSS/BLUE SHIELD | Attending: Internal Medicine

## 2016-09-01 DIAGNOSIS — K219 Gastro-esophageal reflux disease without esophagitis: Secondary | ICD-10-CM | POA: Diagnosis not present

## 2016-09-01 DIAGNOSIS — Z79899 Other long term (current) drug therapy: Secondary | ICD-10-CM | POA: Insufficient documentation

## 2016-09-01 DIAGNOSIS — F17210 Nicotine dependence, cigarettes, uncomplicated: Secondary | ICD-10-CM | POA: Diagnosis not present

## 2016-09-01 DIAGNOSIS — C8300 Small cell B-cell lymphoma, unspecified site: Secondary | ICD-10-CM

## 2016-09-01 DIAGNOSIS — N183 Chronic kidney disease, stage 3 (moderate): Secondary | ICD-10-CM | POA: Diagnosis not present

## 2016-09-01 DIAGNOSIS — Z9221 Personal history of antineoplastic chemotherapy: Secondary | ICD-10-CM | POA: Diagnosis not present

## 2016-09-01 DIAGNOSIS — Z8572 Personal history of non-Hodgkin lymphomas: Secondary | ICD-10-CM | POA: Diagnosis not present

## 2016-09-01 LAB — COMPREHENSIVE METABOLIC PANEL
ALK PHOS: 42 U/L (ref 38–126)
ALT: 20 U/L (ref 17–63)
AST: 26 U/L (ref 15–41)
Albumin: 3.9 g/dL (ref 3.5–5.0)
Anion gap: 3 — ABNORMAL LOW (ref 5–15)
BILIRUBIN TOTAL: 0.5 mg/dL (ref 0.3–1.2)
BUN: 13 mg/dL (ref 6–20)
CALCIUM: 9.4 mg/dL (ref 8.9–10.3)
CHLORIDE: 108 mmol/L (ref 101–111)
CO2: 27 mmol/L (ref 22–32)
CREATININE: 1.39 mg/dL — AB (ref 0.61–1.24)
GFR, EST NON AFRICAN AMERICAN: 53 mL/min — AB (ref >60)
Glucose, Bld: 94 mg/dL (ref 65–99)
Potassium: 3.4 mmol/L — ABNORMAL LOW (ref 3.5–5.1)
Sodium: 138 mmol/L (ref 135–145)
TOTAL PROTEIN: 7 g/dL (ref 6.5–8.1)

## 2016-09-01 LAB — CBC WITH DIFFERENTIAL/PLATELET
BASOS ABS: 0.1 10*3/uL (ref 0–0.1)
Basophils Relative: 1 %
EOS PCT: 2 %
Eosinophils Absolute: 0.1 10*3/uL (ref 0–0.7)
HCT: 37.7 % — ABNORMAL LOW (ref 40.0–52.0)
Hemoglobin: 12.8 g/dL — ABNORMAL LOW (ref 13.0–18.0)
LYMPHS ABS: 1.9 10*3/uL (ref 1.0–3.6)
LYMPHS PCT: 32 %
MCH: 29.7 pg (ref 26.0–34.0)
MCHC: 34 g/dL (ref 32.0–36.0)
MCV: 87.5 fL (ref 80.0–100.0)
Monocytes Absolute: 0.6 10*3/uL (ref 0.2–1.0)
Monocytes Relative: 9 %
NEUTROS ABS: 3.5 10*3/uL (ref 1.4–6.5)
Neutrophils Relative %: 56 %
PLATELETS: 266 10*3/uL (ref 150–440)
RBC: 4.31 MIL/uL — AB (ref 4.40–5.90)
RDW: 15.4 % — ABNORMAL HIGH (ref 11.5–14.5)
WBC: 6.2 10*3/uL (ref 3.8–10.6)

## 2016-09-01 LAB — LACTATE DEHYDROGENASE: LDH: 163 U/L (ref 98–192)

## 2016-09-05 LAB — MULTIPLE MYELOMA PANEL, SERUM
ALBUMIN/GLOB SERPL: 1.6 (ref 0.7–1.7)
Albumin SerPl Elph-Mcnc: 3.9 g/dL (ref 2.9–4.4)
Alpha 1: 0.2 g/dL (ref 0.0–0.4)
Alpha2 Glob SerPl Elph-Mcnc: 0.6 g/dL (ref 0.4–1.0)
B-Globulin SerPl Elph-Mcnc: 0.9 g/dL (ref 0.7–1.3)
GAMMA GLOB SERPL ELPH-MCNC: 0.9 g/dL (ref 0.4–1.8)
GLOBULIN, TOTAL: 2.6 g/dL (ref 2.2–3.9)
IGA: 133 mg/dL (ref 61–437)
IgG (Immunoglobin G), Serum: 681 mg/dL — ABNORMAL LOW (ref 700–1600)
IgM, Serum: 282 mg/dL — ABNORMAL HIGH (ref 20–172)
M Protein SerPl Elph-Mcnc: 0.3 g/dL — ABNORMAL HIGH
Total Protein ELP: 6.5 g/dL (ref 6.0–8.5)

## 2016-09-07 ENCOUNTER — Inpatient Hospital Stay (HOSPITAL_BASED_OUTPATIENT_CLINIC_OR_DEPARTMENT_OTHER): Payer: BLUE CROSS/BLUE SHIELD | Admitting: Internal Medicine

## 2016-09-07 VITALS — BP 118/70 | HR 81 | Temp 98.5°F | Wt 160.1 lb

## 2016-09-07 DIAGNOSIS — C8300 Small cell B-cell lymphoma, unspecified site: Secondary | ICD-10-CM

## 2016-09-07 DIAGNOSIS — Z9221 Personal history of antineoplastic chemotherapy: Secondary | ICD-10-CM | POA: Diagnosis not present

## 2016-09-07 DIAGNOSIS — F17210 Nicotine dependence, cigarettes, uncomplicated: Secondary | ICD-10-CM | POA: Diagnosis not present

## 2016-09-07 DIAGNOSIS — N183 Chronic kidney disease, stage 3 (moderate): Secondary | ICD-10-CM

## 2016-09-07 DIAGNOSIS — Z8572 Personal history of non-Hodgkin lymphomas: Secondary | ICD-10-CM

## 2016-09-07 DIAGNOSIS — Z79899 Other long term (current) drug therapy: Secondary | ICD-10-CM

## 2016-09-07 NOTE — Assessment & Plan Note (Addendum)
#   Stage IV low-grade lymphoma [lymphoma plasmacytic versus marginal zone lymphoma]- status post maintenance Rituxan finished November 2016. CT scan in January 2017/non-contrast showed no evidence of disease.  # Clinically no evidence of recurrence.  Hemoglobin slightly low at 12.8; M protein 0.3gm/dl  # CKD- creatinine- 1.34-improved.    # follow-up with me in 6 months with labs/ 1 week prior.

## 2016-09-07 NOTE — Progress Notes (Signed)
Patient here today for follow up.   

## 2016-09-07 NOTE — Progress Notes (Signed)
[place] OFFICE PROGRESS NOTE  Patient Care Team: Patient, No Pcp Per as PCP - General (General Practice)   SUMMARY OF ONCOLOGIC HISTORY:  . 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 CT [non-contrast]- January 2017- NED  # CKD [stage III creat 1.8]     Malignant lymphoma, lymphoplasmacytic ([place])     INTERVAL HISTORY:  A very pleasant 61 year old male patient with above history of stage IV lymphoplasmacytic lymphoma/marginal zone lymphoma currently status post maintenance rituximab [last treatment November 2016].  Patient continues to work full-time. Appetite is good.  Patient denies any tingling and numbness of his extremities. No headaches. No weight loss no night sweats no fevers. No lumps or bumps. No unusual fatigue.no chest pain or shortness of breath or cough. No recent infections.    REVIEW OF SYSTEMS:  A complete 10 point review of system is done which is negative except mentioned above/history of present illness.   PAST MEDICAL HISTORY :  Past Medical History:  Diagnosis Date  . Anemia   . Arthritis   . Chronic kidney disease   . GERD (gastroesophageal reflux disease)   . Non Hodgkin's lymphoma ([place])     PAST SURGICAL HISTORY :   Past Surgical History:  Procedure Laterality Date  . BONE MARROW BIOPSY    . Left eye surgery  2012  . Left knee surgery  1981   ACL repair    FAMILY HISTORY :   Family History  Problem Relation Age of Onset  . Pancreatic cancer Mother 93  . Prostate cancer Brother 45  . Prostate cancer Father 22    SOCIAL HISTORY:   Social History  Substance Use Topics  . Smoking status: Current Every Day Smoker    Packs/day: 0.25    Years: 10.00     Types: Cigarettes  . Smokeless tobacco: Never Used     Comment: "I only smoke in the bar on occasion"  . Alcohol use 0.0 oz/week     Comment: consumes alcohol on the weekend.    ALLERGIES:  is allergic to no known allergies.  MEDICATIONS:  Current Outpatient Prescriptions  Medication Sig Dispense Refill  . pantoprazole (PROTONIX) 40 MG tablet Take 40 mg by mouth daily.   0   No current facility-administered medications for this visit.     PHYSICAL EXAMINATION: ECOG PERFORMANCE STATUS: 0 - Asymptomatic  BP 118/70 (BP Location: Left Arm, Patient Position: Sitting)   Pulse 81   Temp 98.5 F (36.9 C) (Tympanic)   Wt 160 lb 2 oz (72.6 kg)   BMI 25.84 kg/m   Filed Weights   09/07/16 1420  Weight: 160 lb 2 oz (72.6 kg)    GENERAL: Well-nourished well-developed; Alert, no distress and comfortable. He is Accompanied by family. EYES: no pallor or icterus OROPHARYNX: no thrush or ulceration; good dentition  NECK: supple, no masses felt LYMPH:  no palpable lymphadenopathy in the cervical, axillary or inguinal regions LUNGS: clear to auscultation and  No wheeze or crackles HEART/CVS: regular rate & rhythm and no murmurs; No lower extremity edema ABDOMEN:abdomen soft, non-tender and normal bowel sounds Musculoskeletal:no cyanosis of digits and no clubbing  PSYCH: alert & oriented x 3 with fluent speech NEURO: no focal motor/sensory deficits SKIN:  no  rashes or significant lesions  LABORATORY DATA:  I have reviewed the data as listed    Component Value Date/Time   NA 138 09/01/2016 0800   NA 139 11/08/2013 0824   K 3.4 (L) 09/01/2016 0800   K 4.3 11/08/2013 0824   CL 108 09/01/2016 0800   CL 104 11/08/2013 0824   CO2 27 09/01/2016 0800   CO2 27 11/08/2013 0824   GLUCOSE 94 09/01/2016 0800   GLUCOSE 92 11/08/2013 0824   BUN 13 09/01/2016 0800   BUN 18 11/08/2013 0824   CREATININE 1.39 (H) 09/01/2016 0800   CREATININE 1.85 (H) 04/25/2014 0840   CALCIUM 9.4 09/01/2016  0800   CALCIUM 8.8 11/08/2013 0824   PROT 7.0 09/01/2016 0800   PROT 6.6 02/28/2014 0902   ALBUMIN 3.9 09/01/2016 0800   ALBUMIN 3.5 02/28/2014 0902   AST 26 09/01/2016 0800   AST 14 (L) 02/28/2014 0902   ALT 20 09/01/2016 0800   ALT 19 02/28/2014 0902   ALKPHOS 42 09/01/2016 0800   ALKPHOS 53 02/28/2014 0902   BILITOT 0.5 09/01/2016 0800   BILITOT 0.3 02/28/2014 0902   GFRNONAA 53 (L) 09/01/2016 0800   GFRNONAA 40 (L) 04/25/2014 0840   GFRNONAA 46 (L) 11/08/2013 0824   GFRAA >60 09/01/2016 0800   GFRAA 48 (L) 04/25/2014 0840   GFRAA 53 (L) 11/08/2013 0824    No results found for: SPEP, UPEP  Lab Results  Component Value Date   WBC 6.2 09/01/2016   NEUTROABS 3.5 09/01/2016   HGB 12.8 (L) 09/01/2016   HCT 37.7 (L) 09/01/2016   MCV 87.5 09/01/2016   PLT 266 09/01/2016      Chemistry      Component Value Date/Time   NA 138 09/01/2016 0800   NA 139 11/08/2013 0824   K 3.4 (L) 09/01/2016 0800   K 4.3 11/08/2013 0824   CL 108 09/01/2016 0800   CL 104 11/08/2013 0824   CO2 27 09/01/2016 0800   CO2 27 11/08/2013 0824   BUN 13 09/01/2016 0800   BUN 18 11/08/2013 0824   CREATININE 1.39 (H) 09/01/2016 0800   CREATININE 1.85 (H) 04/25/2014 0840      Component Value Date/Time   CALCIUM 9.4 09/01/2016 0800   CALCIUM 8.8 11/08/2013 0824   ALKPHOS 42 09/01/2016 0800   ALKPHOS 53 02/28/2014 0902   AST 26 09/01/2016 0800   AST 14 (L) 02/28/2014 0902   ALT 20 09/01/2016 0800   ALT 19 02/28/2014 0902   BILITOT 0.5 09/01/2016 0800   BILITOT 0.3 02/28/2014 0902       ASSESSMENT & PLAN:   Malignant lymphoma, lymphoplasmacytic ([place]) # Stage IV low-grade lymphoma [lymphoma plasmacytic versus marginal zone lymphoma]- status post maintenance Rituxan finished November 2016. CT scan in January 2017/non-contrast showed no evidence of disease.  # Clinically no evidence of recurrence.  Hemoglobin slightly low at 12.8; M protein 0.3gm/dl  # CKD- creatinine- 1.34-improved.     # follow-up with me in 6 months with labs/ 1 week prior.     Cammie Sickle, MD 09/08/2016 9:45 PM

## 2017-01-08 IMAGING — CT CT CHEST W/O CM
2 of 4 series · 13 of 36 positions shown, 16 images · non-contrast
Comparison: 07/07/2014

CLINICAL DATA: Lymphoma diagnosed 3 years ago. Chemotherapy for 2
years. Chronic kidney disease. Gastroesophageal reflux disease.

EXAM:
CT CHEST, ABDOMEN AND PELVIS WITHOUT CONTRAST
TECHNIQUE: Multidetector CT imaging of the chest, abdomen and pelvis was
performed following the standard protocol without IV contrast.

[Series 2: cap wo · axial · 0.69mm/px · z∈[-902,-367]mm · 10 of 121 slices shown, 13 images]
[im 7/121  mediastinal]
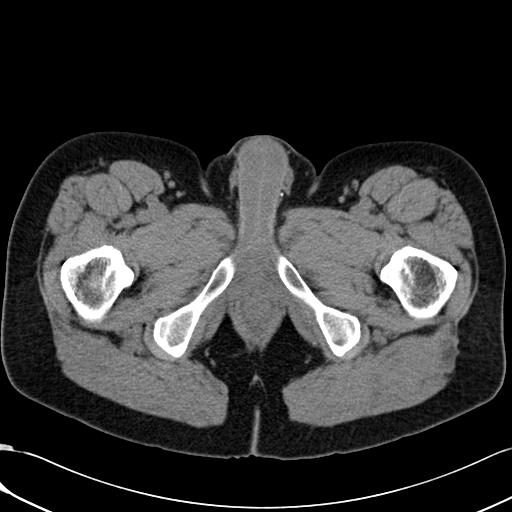
[im 7/121  lung]
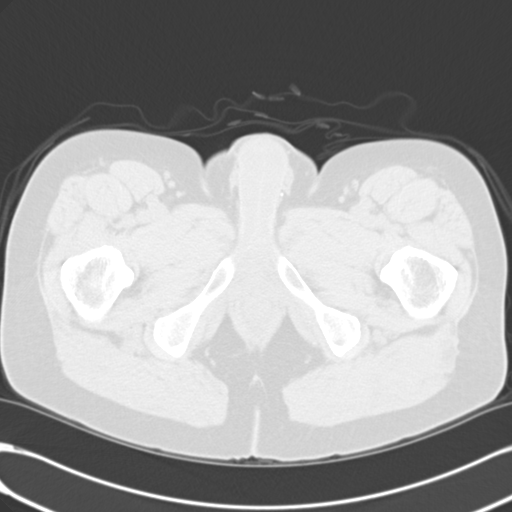
[im 21/121  lung]
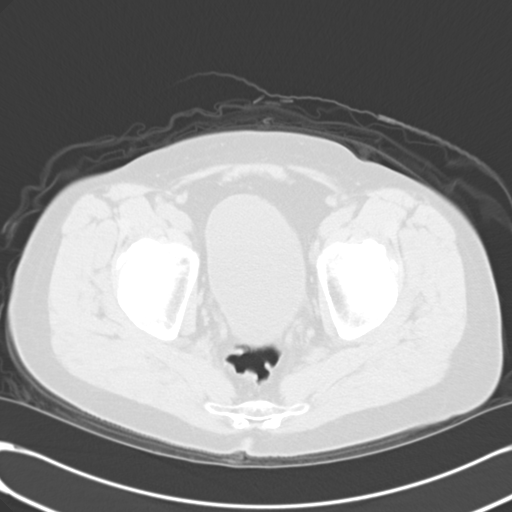
[im 34/121  lung]
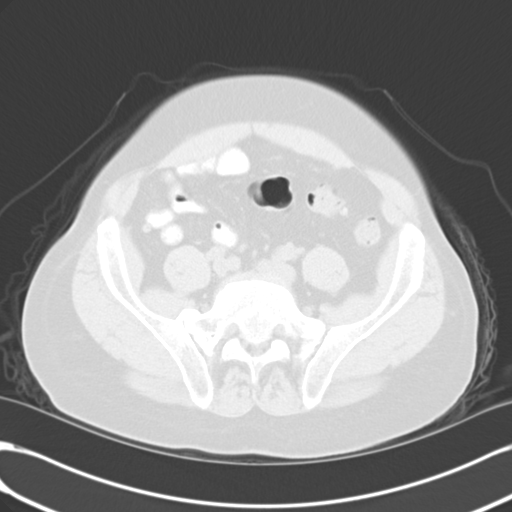
[im 41/121  lung]
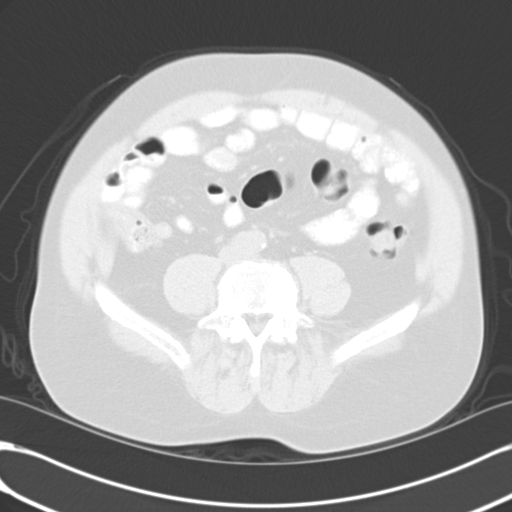
[im 54/121  mediastinal]
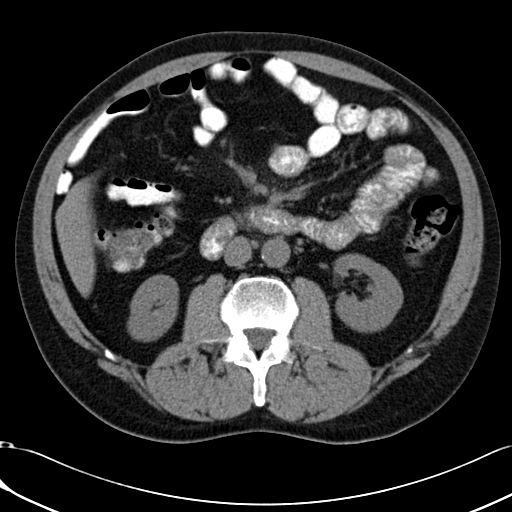
[im 54/121  lung]
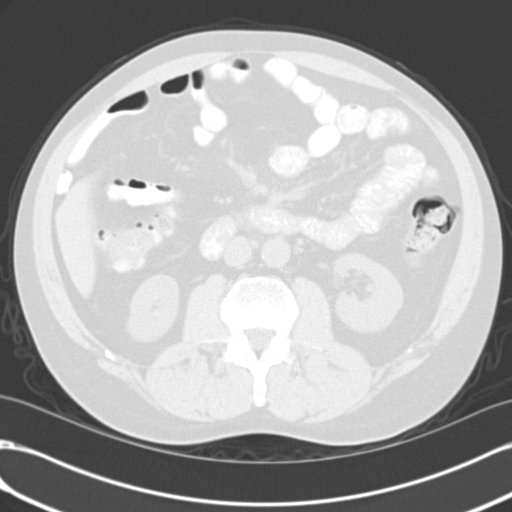
[im 67/121  lung]
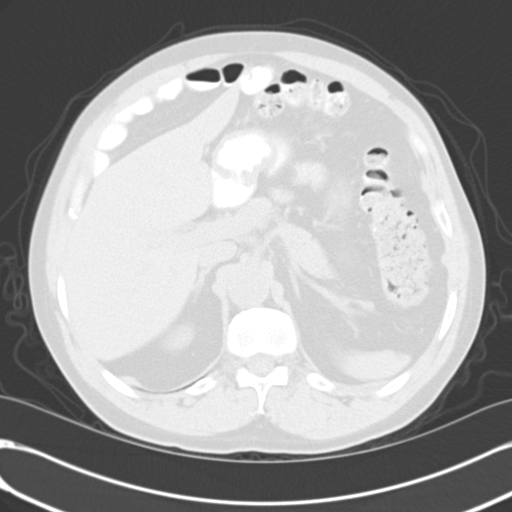
[im 81/121  lung]
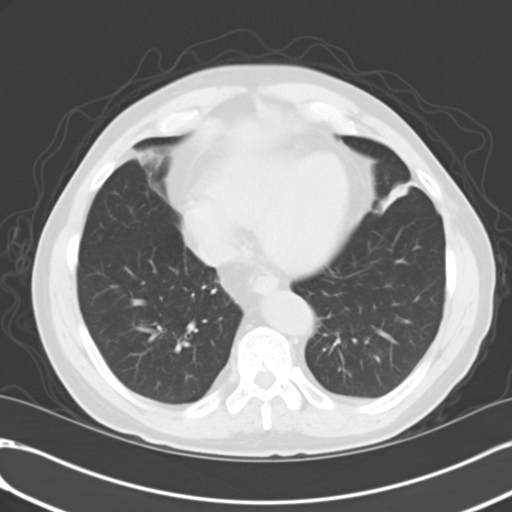
[im 87/121  lung]
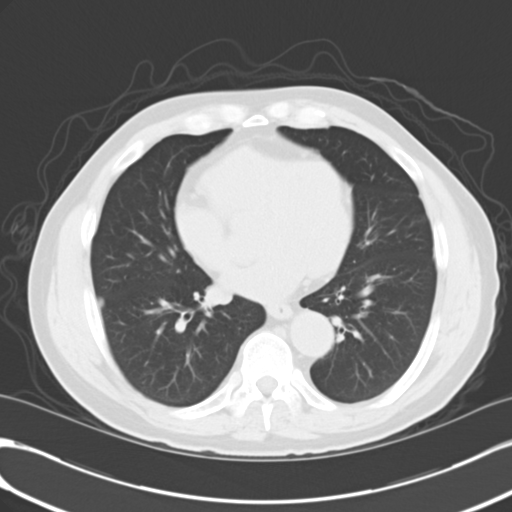
[im 101/121  mediastinal]
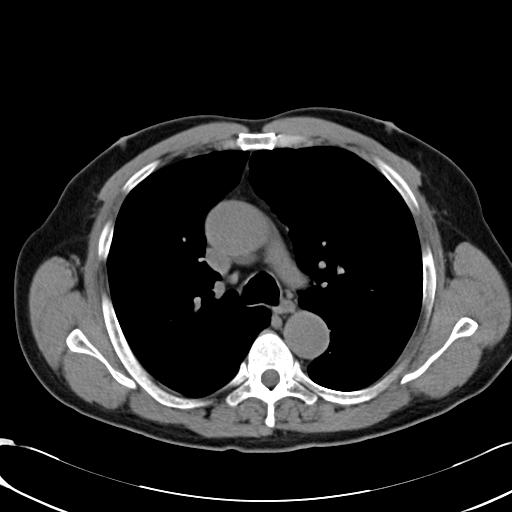
[im 101/121  lung]
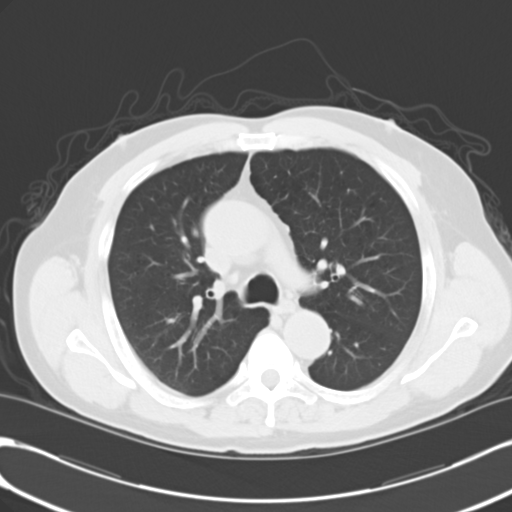
[im 114/121  lung]
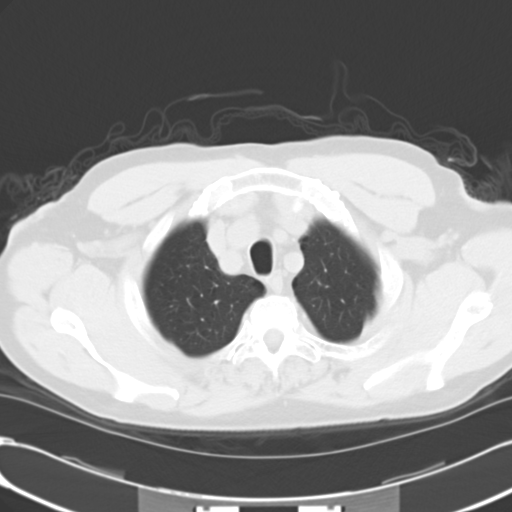

[Series 5: cor cap wo · coronal · 0.62mm/px · 3 of 140 slices shown]
[im 28/140  lung]
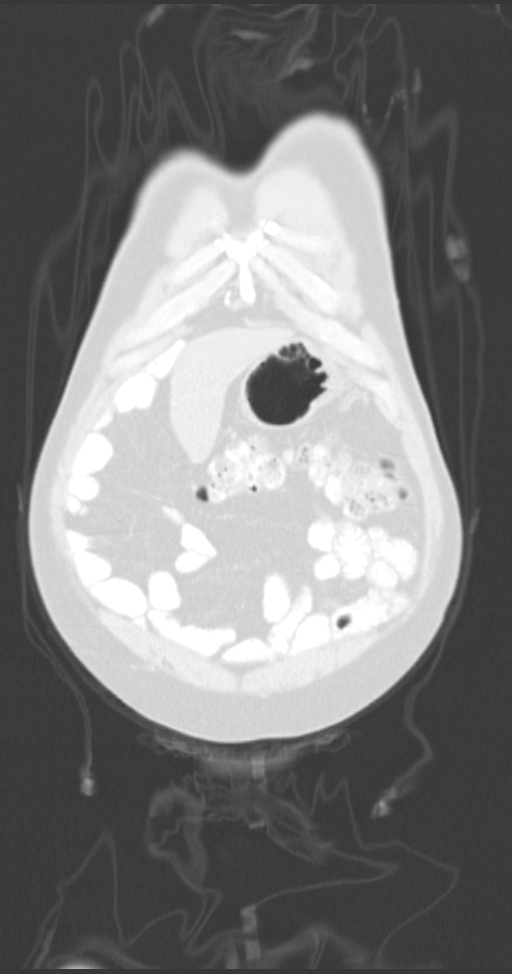
[im 56/140  lung]
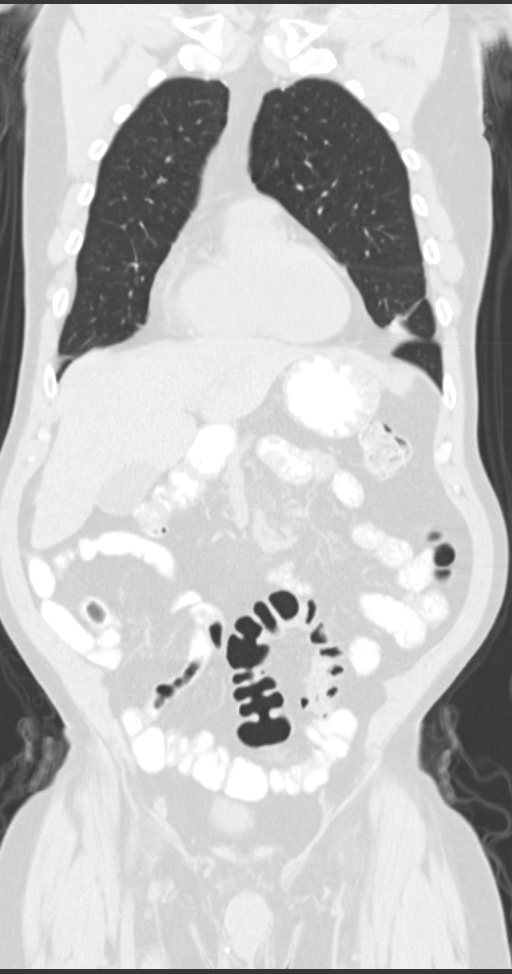
[im 84/140  lung]
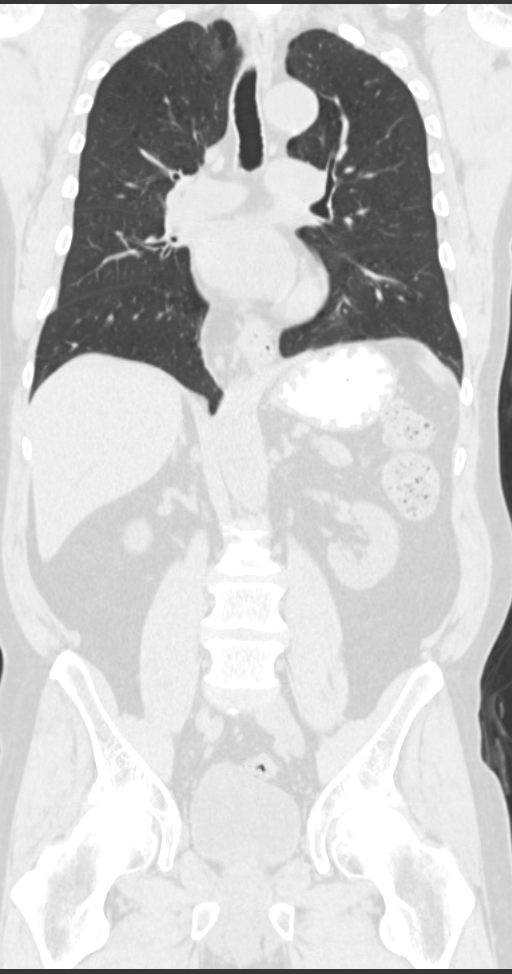

[13 of 36 positions shown; findings below may reference images not displayed]

FINDINGS: CT CHEST

Mediastinum/Nodes: No supraclavicular adenopathy. No axillary
adenopathy. Ascending aortic tortuosity. Measures 4.0 on transverse
imaging, maximally 3.7 on coronal reformats. Unchanged. Normal heart
size, without pericardial effusion. Distal left main or proximal LAD
coronary artery atherosclerosis, image 29/series 2. No mediastinal
or definite hilar adenopathy, given limitations of unenhanced CT.
Small hiatal hernia.

Lungs/Pleura: No pleural fluid. Mild centrilobular emphysema. Clear
lungs.

Musculoskeletal: No acute osseous abnormality.

CT ABDOMEN AND PELVIS

Hepatobiliary: Tiny hepatic dome cyst again identified. Normal
gallbladder, without biliary ductal dilatation.

Pancreas: Normal, without mass or ductal dilatation.

Spleen: Normal in size, without focal abnormality.

Adrenals/Urinary Tract: Normal adrenal glands. Normal kidneys,
without hydronephrosis. Normal urinary bladder.

Stomach/Bowel: Normal remainder of the stomach. Normal colon,
appendix, and terminal ileum. Normal small bowel.

Vascular/Lymphatic: Aortic and branch vessel atherosclerosis. No
abdominopelvic adenopathy.

Reproductive: Mild prostatomegaly.

Other: No significant free fluid.

Musculoskeletal: Degenerative partial fusion of the right sacroiliac
joint.
IMPRESSION: 1. No adenopathy to suggest recurrent or residual lymphoma.
2. Thoracic aortic tortuosity with similar prominence of the
ascending aorta. Based on coronal reformatted images, maximally
cm.
3.  Atherosclerosis, including within the coronary arteries.

## 2017-02-15 ENCOUNTER — Inpatient Hospital Stay: Payer: BLUE CROSS/BLUE SHIELD | Attending: Internal Medicine

## 2017-02-15 DIAGNOSIS — K219 Gastro-esophageal reflux disease without esophagitis: Secondary | ICD-10-CM | POA: Insufficient documentation

## 2017-02-15 DIAGNOSIS — Z8572 Personal history of non-Hodgkin lymphomas: Secondary | ICD-10-CM | POA: Diagnosis not present

## 2017-02-15 DIAGNOSIS — Z79899 Other long term (current) drug therapy: Secondary | ICD-10-CM | POA: Insufficient documentation

## 2017-02-15 DIAGNOSIS — N183 Chronic kidney disease, stage 3 (moderate): Secondary | ICD-10-CM | POA: Insufficient documentation

## 2017-02-15 DIAGNOSIS — Z9221 Personal history of antineoplastic chemotherapy: Secondary | ICD-10-CM | POA: Diagnosis not present

## 2017-02-15 DIAGNOSIS — C8300 Small cell B-cell lymphoma, unspecified site: Secondary | ICD-10-CM

## 2017-02-15 DIAGNOSIS — F17210 Nicotine dependence, cigarettes, uncomplicated: Secondary | ICD-10-CM | POA: Diagnosis not present

## 2017-02-15 LAB — CBC WITH DIFFERENTIAL/PLATELET
Basophils Absolute: 0.1 10*3/uL (ref 0–0.1)
Basophils Relative: 1 %
EOS ABS: 0.1 10*3/uL (ref 0–0.7)
EOS PCT: 2 %
HCT: 37.9 % — ABNORMAL LOW (ref 40.0–52.0)
Hemoglobin: 12.5 g/dL — ABNORMAL LOW (ref 13.0–18.0)
LYMPHS ABS: 1.8 10*3/uL (ref 1.0–3.6)
LYMPHS PCT: 27 %
MCH: 29.4 pg (ref 26.0–34.0)
MCHC: 32.9 g/dL (ref 32.0–36.0)
MCV: 89.1 fL (ref 80.0–100.0)
MONO ABS: 0.4 10*3/uL (ref 0.2–1.0)
MONOS PCT: 6 %
Neutro Abs: 4.3 10*3/uL (ref 1.4–6.5)
Neutrophils Relative %: 64 %
PLATELETS: 262 10*3/uL (ref 150–440)
RBC: 4.26 MIL/uL — ABNORMAL LOW (ref 4.40–5.90)
RDW: 15.2 % — ABNORMAL HIGH (ref 11.5–14.5)
WBC: 6.6 10*3/uL (ref 3.8–10.6)

## 2017-02-15 LAB — COMPREHENSIVE METABOLIC PANEL
ALBUMIN: 4 g/dL (ref 3.5–5.0)
ALT: 16 U/L — AB (ref 17–63)
AST: 28 U/L (ref 15–41)
Alkaline Phosphatase: 52 U/L (ref 38–126)
Anion gap: 9 (ref 5–15)
BUN: 14 mg/dL (ref 6–20)
CHLORIDE: 106 mmol/L (ref 101–111)
CO2: 23 mmol/L (ref 22–32)
CREATININE: 1.46 mg/dL — AB (ref 0.61–1.24)
Calcium: 9.5 mg/dL (ref 8.9–10.3)
GFR calc Af Amer: 58 mL/min — ABNORMAL LOW (ref >60)
GFR, EST NON AFRICAN AMERICAN: 50 mL/min — AB (ref >60)
GLUCOSE: 159 mg/dL — AB (ref 65–99)
Potassium: 3.5 mmol/L (ref 3.5–5.1)
Sodium: 138 mmol/L (ref 135–145)
Total Bilirubin: 0.4 mg/dL (ref 0.3–1.2)
Total Protein: 7 g/dL (ref 6.5–8.1)

## 2017-02-16 LAB — KAPPA/LAMBDA LIGHT CHAINS
KAPPA FREE LGHT CHN: 56.1 mg/L — AB (ref 3.3–19.4)
KAPPA, LAMDA LIGHT CHAIN RATIO: 3.34 — AB (ref 0.26–1.65)
LAMDA FREE LIGHT CHAINS: 16.8 mg/L (ref 5.7–26.3)

## 2017-02-16 LAB — MULTIPLE MYELOMA PANEL, SERUM
ALBUMIN SERPL ELPH-MCNC: 3.7 g/dL (ref 2.9–4.4)
ALPHA 1: 0.2 g/dL (ref 0.0–0.4)
ALPHA2 GLOB SERPL ELPH-MCNC: 0.7 g/dL (ref 0.4–1.0)
Albumin/Glob SerPl: 1.3 (ref 0.7–1.7)
B-GLOBULIN SERPL ELPH-MCNC: 0.9 g/dL (ref 0.7–1.3)
Gamma Glob SerPl Elph-Mcnc: 1.1 g/dL (ref 0.4–1.8)
Globulin, Total: 2.9 g/dL (ref 2.2–3.9)
IGG (IMMUNOGLOBIN G), SERUM: 797 mg/dL (ref 700–1600)
IgA: 159 mg/dL (ref 61–437)
IgM (Immunoglobulin M), Srm: 297 mg/dL — ABNORMAL HIGH (ref 20–172)
M PROTEIN SERPL ELPH-MCNC: 0.4 g/dL — AB
TOTAL PROTEIN ELP: 6.6 g/dL (ref 6.0–8.5)

## 2017-02-22 ENCOUNTER — Other Ambulatory Visit: Payer: Self-pay

## 2017-02-22 ENCOUNTER — Encounter: Payer: Self-pay | Admitting: Internal Medicine

## 2017-02-22 ENCOUNTER — Inpatient Hospital Stay: Payer: BLUE CROSS/BLUE SHIELD | Admitting: Internal Medicine

## 2017-02-22 VITALS — BP 114/69 | HR 71 | Temp 97.9°F | Resp 20 | Ht 66.0 in | Wt 164.0 lb

## 2017-02-22 DIAGNOSIS — C8300 Small cell B-cell lymphoma, unspecified site: Secondary | ICD-10-CM

## 2017-02-22 NOTE — Progress Notes (Signed)
Patient came out of exam room before Dr. Rogue Bussing saw patient, and stated that he had an emergency and he needed to leave right now.  I asked the patient if he wanted to reschedule, and he said to have someone call him later to reschedule.

## 2017-02-22 NOTE — Progress Notes (Signed)
Patient left before seen in the clinic because of some personal emergency.  His visit will be rescheduled

## 2017-03-20 ENCOUNTER — Inpatient Hospital Stay: Payer: BLUE CROSS/BLUE SHIELD | Admitting: Internal Medicine

## 2017-03-23 ENCOUNTER — Inpatient Hospital Stay: Payer: BLUE CROSS/BLUE SHIELD | Attending: Internal Medicine | Admitting: Nurse Practitioner

## 2017-03-23 ENCOUNTER — Encounter: Payer: Self-pay | Admitting: Nurse Practitioner

## 2017-03-23 VITALS — BP 119/78 | HR 71 | Temp 98.1°F | Resp 16 | Wt 159.0 lb

## 2017-03-23 DIAGNOSIS — F17210 Nicotine dependence, cigarettes, uncomplicated: Secondary | ICD-10-CM | POA: Insufficient documentation

## 2017-03-23 DIAGNOSIS — Z9221 Personal history of antineoplastic chemotherapy: Secondary | ICD-10-CM | POA: Insufficient documentation

## 2017-03-23 DIAGNOSIS — Z79899 Other long term (current) drug therapy: Secondary | ICD-10-CM | POA: Diagnosis not present

## 2017-03-23 DIAGNOSIS — Z8572 Personal history of non-Hodgkin lymphomas: Secondary | ICD-10-CM | POA: Insufficient documentation

## 2017-03-23 DIAGNOSIS — C8300 Small cell B-cell lymphoma, unspecified site: Secondary | ICD-10-CM

## 2017-03-23 DIAGNOSIS — N183 Chronic kidney disease, stage 3 (moderate): Secondary | ICD-10-CM | POA: Diagnosis not present

## 2017-03-23 DIAGNOSIS — K219 Gastro-esophageal reflux disease without esophagitis: Secondary | ICD-10-CM | POA: Insufficient documentation

## 2017-03-23 NOTE — Assessment & Plan Note (Signed)
#    Malignant lymphoplasmacytic lymphoma-stage IV low-grade-completed maintenance Rituxan 11/16.  CT 1/17 showed no evidence of disease.  Clinically no evidence of recurrence therefore no need to scan at this time.  Hemoglobin 12.5 (slightly low but stable).  M protein 0.4 gm/dl.   # CKD-creatinine 1.46.  Stable.  Continue to monitor.  Encourage patient to establish relationship with PCP.   Follow-up with labs in 6 months, 1 week later see Dr. Rogue Bussing.

## 2017-03-23 NOTE — Progress Notes (Signed)
[place] OFFICE PROGRESS NOTE  Patient Care Team: Patient, No Pcp Per as PCP - General (General Practice)   SUMMARY OF ONCOLOGIC HISTORY:  . 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  Last treatment November 2016. 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  Appetite is stable saying "I eat whatever I want".  Denies numbness or tingling.  Denies headaches.  Denies weight loss, night sweats, fevers, or chills.  Denies new lumps or bumps.  Denies unusual fatigue, chest pain, shortness of breath, or cough.  No changes to health and no recent infections.   REVIEW OF SYSTEMS:  A complete 10 point review of system is done which is negative except mentioned above/history of present illness.   PAST MEDICAL HISTORY :  Past Medical History:  Diagnosis Date  . Anemia   . Arthritis   . Chronic kidney disease   . GERD (gastroesophageal reflux disease)   . Non Hodgkin's lymphoma ([place])     PAST SURGICAL HISTORY :   Past Surgical History:  Procedure Laterality Date  . BONE MARROW BIOPSY    . Left eye surgery  2012  . Left knee surgery  1981   ACL repair    FAMILY HISTORY :   Family History  Problem Relation Age of Onset  . Pancreatic cancer Mother 41  . Prostate cancer Brother 66  . Prostate cancer Father 35    SOCIAL HISTORY:   Social History   Tobacco Use  .  Smoking status: Current Every Day Smoker    Packs/day: 0.25    Years: 10.00    Pack years: 2.50    Types: Cigarettes  . Smokeless tobacco: Never Used  . Tobacco comment: "I only smoke in the bar on occasion"  Substance Use Topics  . Alcohol use: Yes    Alcohol/week: 0.0 oz    Comment: consumes alcohol on the weekend.  . Drug use: No    ALLERGIES:  is allergic to no known allergies.  MEDICATIONS:  Current Outpatient Medications  Medication Sig Dispense Refill  . pantoprazole (PROTONIX) 40 MG tablet Take 40 mg by mouth daily.   0   No current facility-administered medications for this visit.     PHYSICAL EXAMINATION: ECOG PERFORMANCE STATUS: 0 - Asymptomatic  BP 119/78 (BP Location: Left Arm, Patient Position: Sitting)   Pulse 71   Temp 98.1 F (36.7 C) (Tympanic)   Resp 16   Wt 159 lb (72.1 kg)   BMI 25.66 kg/m   Filed Weights   03/23/17 1522  Weight: 159 lb (72.1 kg)    GENERAL: Well-nourished well-developed; Alert, no distress and comfortable. He is alone. EYES: no pallor or icterus OROPHARYNX: no thrush or ulceration; good dentition  NECK: supple, no masses felt LYMPH:  no palpable lymphadenopathy in the cervical, axillary or inguinal regions LUNGS: clear to auscultation and  No wheeze or crackles HEART/CVS: regular rate & rhythm and no murmurs; No lower extremity edema ABDOMEN:  abdomen soft, non-tender and normal bowel sounds Musculoskeletal: no cyanosis of digits and no clubbing  PSYCH: alert & oriented x 3 with fluent speech NEURO: no focal motor/sensory deficits SKIN: no rashes or significant lesions  LABORATORY DATA:  I have reviewed the data as listed    Component Value Date/Time   NA 138 02/15/2017 1356   NA 139 11/08/2013 0824   K 3.5 02/15/2017 1356   K 4.3 11/08/2013 0824   CL 106 02/15/2017 1356   CL 104 11/08/2013 0824   CO2 23 02/15/2017 1356   CO2 27 11/08/2013 0824   GLUCOSE 159 (H) 02/15/2017 1356   GLUCOSE 92 11/08/2013 0824   BUN  14 02/15/2017 1356   BUN 18 11/08/2013 0824   CREATININE 1.46 (H) 02/15/2017 1356   CREATININE 1.85 (H) 04/25/2014 0840   CALCIUM 9.5 02/15/2017 1356   CALCIUM 8.8 11/08/2013 0824   PROT 7.0 02/15/2017 1356   PROT 6.6 02/28/2014 0902   ALBUMIN 4.0 02/15/2017 1356   ALBUMIN 3.5 02/28/2014 0902   AST 28 02/15/2017 1356   AST 14 (L) 02/28/2014 0902   ALT 16 (L) 02/15/2017 1356   ALT 19 02/28/2014 0902   ALKPHOS 52 02/15/2017 1356   ALKPHOS 53 02/28/2014 0902   BILITOT 0.4 02/15/2017 1356   BILITOT 0.3 02/28/2014 0902   GFRNONAA 50 (L) 02/15/2017 1356   GFRNONAA 40 (L) 04/25/2014 0840   GFRNONAA 46 (L) 11/08/2013 0824   GFRAA 58 (L) 02/15/2017 1356   GFRAA 48 (L) 04/25/2014 0840   GFRAA 53 (L) 11/08/2013 0824    No results found for: SPEP, UPEP  Lab Results  Component Value Date   WBC 6.6 02/15/2017   NEUTROABS 4.3 02/15/2017   HGB 12.5 (L) 02/15/2017   HCT 37.9 (L) 02/15/2017   MCV 89.1 02/15/2017   PLT 262 02/15/2017      Chemistry      Component Value Date/Time   NA 138 02/15/2017 1356   NA 139 11/08/2013 0824   K 3.5 02/15/2017 1356   K 4.3 11/08/2013 0824   CL 106 02/15/2017 1356   CL 104 11/08/2013 0824   CO2 23 02/15/2017 1356   CO2 27 11/08/2013 0824   BUN 14 02/15/2017 1356   BUN 18 11/08/2013 0824   CREATININE 1.46 (H) 02/15/2017 1356   CREATININE 1.85 (H) 04/25/2014 0840      Component Value Date/Time   CALCIUM 9.5 02/15/2017 1356   CALCIUM 8.8 11/08/2013 0824   ALKPHOS 52 02/15/2017 1356   ALKPHOS 53 02/28/2014 0902   AST 28 02/15/2017 1356   AST 14 (L) 02/28/2014 0902   ALT 16 (L) 02/15/2017 1356   ALT 19 02/28/2014 0902   BILITOT 0.4 02/15/2017 1356   BILITOT 0.3 02/28/2014 0902       ASSESSMENT & PLAN:   Malignant lymphoma, 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  CT 1/17 showed no evidence of disease.  Clinically no evidence of recurrence therefore no  need to scan at this time.  Hemoglobin 12.5 (slightly low but stable).  M protein 0.4 gm/dl.   # CKD-creatinine 1.46.  Stable.  Continue to monitor.  Encourage patient to establish relationship with PCP.   Follow-up with labs in 6 months, 1 week later see Dr. Rogue Bussing.     Verlon Au, NP 03/23/2017 4:39 PM

## 2017-05-19 ENCOUNTER — Ambulatory Visit
Admission: RE | Admit: 2017-05-19 | Discharge: 2017-05-19 | Disposition: A | Discharge status: Home / Self Care | Payer: BLUE CROSS/BLUE SHIELD | Source: Ambulatory Visit | Attending: Oncology | Admitting: Oncology

## 2017-05-19 ENCOUNTER — Encounter: Payer: Self-pay | Admitting: Oncology

## 2017-05-19 ENCOUNTER — Other Ambulatory Visit: Payer: Self-pay

## 2017-05-19 ENCOUNTER — Inpatient Hospital Stay: Payer: BLUE CROSS/BLUE SHIELD | Attending: Oncology | Admitting: Oncology

## 2017-05-19 ENCOUNTER — Telehealth: Payer: Self-pay | Admitting: *Deleted

## 2017-05-19 VITALS — BP 154/80 | HR 75 | Temp 97.8°F | Resp 20

## 2017-05-19 DIAGNOSIS — C8300 Small cell B-cell lymphoma, unspecified site: Secondary | ICD-10-CM | POA: Diagnosis not present

## 2017-05-19 DIAGNOSIS — J984 Other disorders of lung: Secondary | ICD-10-CM | POA: Diagnosis not present

## 2017-05-19 DIAGNOSIS — R059 Cough, unspecified: Secondary | ICD-10-CM

## 2017-05-19 DIAGNOSIS — R05 Cough: Secondary | ICD-10-CM

## 2017-05-19 DIAGNOSIS — F17210 Nicotine dependence, cigarettes, uncomplicated: Secondary | ICD-10-CM | POA: Insufficient documentation

## 2017-05-19 DIAGNOSIS — N183 Chronic kidney disease, stage 3 (moderate): Secondary | ICD-10-CM | POA: Diagnosis not present

## 2017-05-19 MED ORDER — BENZONATATE 200 MG PO CAPS: 200.0000 mg | ORAL_CAPSULE | Freq: Three times a day (TID) | ORAL | 0 refills | Status: DC | PRN

## 2017-05-19 NOTE — Progress Notes (Signed)
Patient presents to clinic with c/o persistent cough. Pt is not using any OTC medications for his cough. 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

## 2017-05-19 NOTE — Telephone Encounter (Signed)
Patient reports cough for 3 weeks, denies pain nor fever. States it does not hurt, No congestion, just keeps coughing. Reports that he does cough up green phlegm. Please advise.

## 2017-05-19 NOTE — Progress Notes (Signed)
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 CT [non-contrast]- January 2017- NED  # CKD [stage III creat 1.8]  Interval history-Patient was last seen by Beckey Rutter NP for follow-up.  He reported feeling well.  His appetite had remained stable.  He denied any new numbness or tingling.  He denies any headaches or weight loss, night sweats, fevers or chills.  He denies any new lumps or bumps.  No recent infections.  There is no clinical evidence of recurrence and no need for scans at that time.  His hemoglobin remained stable and his M protein was 0.4%.  He was scheduled to follow-up in 6 months with labs and to see Dr. Rogue Bussing.  Patient complains of cough. States its been going on for 2 weeks. Occasional spuutm production. Unknonwn color. No fevers. No body aches. No pain. Histoy of bronchitis as a child.  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  He thinks he is just picked something up from one of his patients. His only complaint is his cough that keeps him up at night.  He has not tried anything OTC.  He states he cannot take any liquid cough medicine.  Otherwise he feels well.  He denies any chest pain, shortness of breath, constipation, diarrhea, urinary  complaints or headaches.  ECOG FS:0 - Asymptomatic  Review of systems- Review of Systems  Constitutional: Negative for chills, fever, malaise/fatigue and weight loss.  HENT: Negative.   Eyes: Negative.   Respiratory: Positive for cough (past 2 weeks ).   Cardiovascular: Negative for chest pain, palpitations and leg swelling.  Gastrointestinal: Negative for abdominal pain, diarrhea, nausea and vomiting.  Genitourinary: Negative.   Skin: Negative.   Neurological: Negative.  Negative for weakness.  Endo/Heme/Allergies: Negative.   Psychiatric/Behavioral: Negative.      Current treatment- Observation   Allergies  Allergen Reactions  . No Known Allergies      Past Medical History:  Diagnosis Date  . Anemia   . Arthritis   . Chronic kidney disease   . GERD (gastroesophageal reflux disease)   . Non Hodgkin's lymphoma [facility])      Past Surgical History:  Procedure Laterality Date  . BONE MARROW BIOPSY    . Left eye surgery  2012  . Left knee surgery  1981   ACL repair    Social History   Socioeconomic History  . Marital status: Single    Spouse name: Not on file  . Number of children: Not on file  . Years of education: Not on file  . Highest education level: Not on file  Social Needs  . Financial resource strain: Not on file  . Food insecurity - worry: Not on file  . Food insecurity - inability: Not on file  . Transportation needs - medical: Not on file  . Transportation needs -  non-medical: Not on file  Occupational History  . Not on file  Tobacco Use  . Smoking status: Current Every Day Smoker    Packs/day: 0.25    Years: 10.00    Pack years: 2.50    Types: Cigarettes  . Smokeless tobacco: Never Used  . Tobacco comment: "I only smoke in the bar on occasion"  Substance and Sexual Activity  . Alcohol use: Yes    Alcohol/week: 0.0 oz    Comment: consumes alcohol on the weekend.  . Drug use: No  . Sexual activity: No  Other Topics Concern  . Not on file    Social History Narrative  . Not on file    Family History  Problem Relation Age of Onset  . Pancreatic cancer Mother 83  . Prostate cancer Brother 69  . Prostate cancer Father 19     Current Outpatient Medications:  .  pantoprazole (PROTONIX) 40 MG tablet, Take 40 mg by mouth daily. , Disp: , Rfl: 0 .  benzonatate (TESSALON) 200 MG capsule, Take 1 capsule (200 mg total) by mouth 3 (three) times daily as needed for cough., Disp: 20 capsule, Rfl: 0  Physical exam:  Vitals:   05/19/17 1400  BP: (!) 154/80  Pulse: 75  Resp: 20  Temp: 97.8 F (36.6 C)  TempSrc: Tympanic   Physical Exam  Constitutional: He is oriented to person, place, and time and well-developed, well-nourished, and in no distress.  HENT:  Head: Normocephalic and atraumatic.  Eyes: Pupils are equal, round, and reactive to light.  Neck: Normal range of motion. Neck supple.  Pulmonary/Chest: Effort normal and breath sounds normal.  Abdominal: Soft. Bowel sounds are normal.  Musculoskeletal: Normal range of motion.  Neurological: He is alert and oriented to person, place, and time.  Skin: Skin is warm and dry. He is not diaphoretic.     CMP Latest Ref Rng & Units 02/15/2017  Glucose 65 - 99 mg/dL 159(H)  BUN 6 - 20 mg/dL 14  Creatinine 0.61 - 1.24 mg/dL 1.46(H)  Sodium 135 - 145 mmol/L 138  Potassium 3.5 - 5.1 mmol/L 3.5  Chloride 101 - 111 mmol/L 106  CO2 22 - 32 mmol/L 23  Calcium 8.9 - 10.3 mg/dL 9.5  Total Protein 6.5 - 8.1 g/dL 7.0  Total Bilirubin 0.3 - 1.2 mg/dL 0.4  Alkaline Phos 38 - 126 U/L 52  AST 15 - 41 U/L 28  ALT 17 - 63 U/L 16(L)   CBC Latest Ref Rng & Units 02/15/2017  WBC 3.8 - 10.6 K/uL 6.6  Hemoglobin 13.0 - 18.0 g/dL 12.5(L)  Hematocrit 40.0 - 52.0 % 37.9(L)  Platelets 150 - 440 K/uL 262    No images are attached to the encounter.  Dg Chest 2 View  Result Date: 05/19/2017 CLINICAL DATA:  Cough for 2 weeks.  History of lymphoma EXAM: CHEST  2 VIEW COMPARISON:  Chest CT  April 17, 2015 FINDINGS: There is no edema or consolidation. There is slight scarring in the right upper lobe. The heart size and pulmonary vascularity are normal. No adenopathy evident. No bone lesions appreciable. IMPRESSION: Slight scarring right upper lobe. No edema or consolidation. No evident adenopathy. Electronically Signed   By: Lowella Grip III M.D.   On: 05/19/2017 14:00     Assessment and plan- Patient is a 62 y.o. male who presents for a cough for the past 2 weeks.  He has minimal sputum production; unclear of color.  He is afebrile.  Vital  signs are stable.  Chest x-ray ordered.  Patient's lungs are clear.  Oxygen saturations are 100%.  Patient appears well.  No new lumps or bumps.   1. Chest X-ray Negative for acute process. 2. Cough: RX Tessalon 200 mg tablets q 8 PRN. Patient verbalized understanding of medication and when to take.  Patient told to call if cough does not resolve. 3. RTC as scheduled to see Dr. Rogue Bussing with labs.    Visit Diagnosis 1. Cough   2. 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 He also understands that He can call clinic at any time with any questions, concerns, or complaints.   Greater than 50% was spent in counseling and coordination of care with this patient including but not limited to discussion of the relevant topics above (See A&P) including, but not limited to diagnosis and management of acute and chronic medical conditions.    Faythe Casa, AGNP-C [facility]- [phone] Pager- [phone] 05/19/2017 7:42 PM

## 2017-05-19 NOTE — Telephone Encounter (Signed)
Patient coming in this afternoon for CXR at 130 and see NP at 230PM

## 2017-09-21 ENCOUNTER — Inpatient Hospital Stay: Payer: BLUE CROSS/BLUE SHIELD | Attending: Internal Medicine

## 2017-09-21 DIAGNOSIS — C8300 Small cell B-cell lymphoma, unspecified site: Secondary | ICD-10-CM | POA: Diagnosis present

## 2017-09-21 DIAGNOSIS — D472 Monoclonal gammopathy: Secondary | ICD-10-CM | POA: Insufficient documentation

## 2017-09-21 DIAGNOSIS — N189 Chronic kidney disease, unspecified: Secondary | ICD-10-CM | POA: Diagnosis not present

## 2017-09-21 LAB — COMPREHENSIVE METABOLIC PANEL
ALT: 17 U/L (ref 17–63)
AST: 21 U/L (ref 15–41)
Albumin: 4 g/dL (ref 3.5–5.0)
Alkaline Phosphatase: 53 U/L (ref 38–126)
Anion gap: 8 (ref 5–15)
BUN: 16 mg/dL (ref 6–20)
CHLORIDE: 109 mmol/L (ref 101–111)
CO2: 22 mmol/L (ref 22–32)
Calcium: 9.1 mg/dL (ref 8.9–10.3)
Creatinine, Ser: 1.32 mg/dL — ABNORMAL HIGH (ref 0.61–1.24)
GFR, EST NON AFRICAN AMERICAN: 56 mL/min — AB (ref >60)
Glucose, Bld: 95 mg/dL (ref 65–99)
POTASSIUM: 3.5 mmol/L (ref 3.5–5.1)
Sodium: 139 mmol/L (ref 135–145)
Total Bilirubin: 1 mg/dL (ref 0.3–1.2)
Total Protein: 7.1 g/dL (ref 6.5–8.1)

## 2017-09-21 LAB — CBC WITH DIFFERENTIAL/PLATELET
BASOS ABS: 0.1 10*3/uL (ref 0–0.1)
Basophils Relative: 1 %
EOS ABS: 0.1 10*3/uL (ref 0–0.7)
Eosinophils Relative: 2 %
HCT: 37.9 % — ABNORMAL LOW (ref 40.0–52.0)
HEMOGLOBIN: 12.9 g/dL — AB (ref 13.0–18.0)
LYMPHS ABS: 1.7 10*3/uL (ref 1.0–3.6)
LYMPHS PCT: 22 %
MCH: 29.9 pg (ref 26.0–34.0)
MCHC: 34.1 g/dL (ref 32.0–36.0)
MCV: 87.5 fL (ref 80.0–100.0)
Monocytes Absolute: 0.7 10*3/uL (ref 0.2–1.0)
Monocytes Relative: 9 %
NEUTROS PCT: 66 %
Neutro Abs: 5.3 10*3/uL (ref 1.4–6.5)
Platelets: 253 10*3/uL (ref 150–440)
RBC: 4.33 MIL/uL — AB (ref 4.40–5.90)
RDW: 15.2 % — ABNORMAL HIGH (ref 11.5–14.5)
WBC: 7.8 10*3/uL (ref 3.8–10.6)

## 2017-09-22 LAB — KAPPA/LAMBDA LIGHT CHAINS
KAPPA FREE LGHT CHN: 60.7 mg/L — AB (ref 3.3–19.4)
KAPPA, LAMDA LIGHT CHAIN RATIO: 4.86 — AB (ref 0.26–1.65)
LAMDA FREE LIGHT CHAINS: 12.5 mg/L (ref 5.7–26.3)

## 2017-09-24 LAB — MULTIPLE MYELOMA PANEL, SERUM
ALBUMIN SERPL ELPH-MCNC: 3.7 g/dL (ref 2.9–4.4)
ALPHA 1: 0.2 g/dL (ref 0.0–0.4)
ALPHA2 GLOB SERPL ELPH-MCNC: 0.6 g/dL (ref 0.4–1.0)
Albumin/Glob SerPl: 1.4 (ref 0.7–1.7)
B-GLOBULIN SERPL ELPH-MCNC: 0.9 g/dL (ref 0.7–1.3)
GAMMA GLOB SERPL ELPH-MCNC: 1 g/dL (ref 0.4–1.8)
GLOBULIN, TOTAL: 2.8 g/dL (ref 2.2–3.9)
IGG (IMMUNOGLOBIN G), SERUM: 795 mg/dL (ref 700–1600)
IgA: 168 mg/dL (ref 61–437)
IgM (Immunoglobulin M), Srm: 331 mg/dL — ABNORMAL HIGH (ref 20–172)
M PROTEIN SERPL ELPH-MCNC: 0.4 g/dL — AB
TOTAL PROTEIN ELP: 6.5 g/dL (ref 6.0–8.5)

## 2017-09-28 ENCOUNTER — Encounter: Payer: Self-pay | Admitting: Oncology

## 2017-09-28 ENCOUNTER — Inpatient Hospital Stay (HOSPITAL_BASED_OUTPATIENT_CLINIC_OR_DEPARTMENT_OTHER): Payer: BLUE CROSS/BLUE SHIELD | Admitting: Oncology

## 2017-09-28 ENCOUNTER — Other Ambulatory Visit: Payer: Self-pay

## 2017-09-28 VITALS — BP 132/83 | HR 83 | Temp 97.9°F | Resp 20 | Ht 66.0 in | Wt 161.2 lb

## 2017-09-28 DIAGNOSIS — C8300 Small cell B-cell lymphoma, unspecified site: Secondary | ICD-10-CM

## 2017-09-28 DIAGNOSIS — N189 Chronic kidney disease, unspecified: Secondary | ICD-10-CM

## 2017-09-28 DIAGNOSIS — D472 Monoclonal gammopathy: Secondary | ICD-10-CM | POA: Diagnosis not present

## 2017-09-28 NOTE — Progress Notes (Signed)
[place] OFFICE PROGRESS NOTE  Patient Care Team: Center, [facility] as PCP - General   SUMMARY OF ONCOLOGIC HISTORY:  . 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  Last treatment November 2016.  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  Maintains a stable appetite.  Denies any neuropathy, headaches, weight loss, night sweats, fevers or chills.  He denies any new lumps or bumps.  Denies any unusual fatigue, chest pain shortness of breath or cough.   He was seen in symptom management clinic in February for cough.  Chest x-ray negative for acute process.  He was given a prescription for Tessalon 200 mg tablets every 8 hours PRN.  States this helped and symptoms resolved.   REVIEW OF SYSTEMS:  Review of Systems  Constitutional: Negative.  Negative for chills, fever, malaise/fatigue and weight loss.  HENT: Negative for congestion and ear pain.   Eyes: Negative.  Negative for blurred vision and double vision.  Respiratory: Negative.  Negative for cough, sputum production and shortness of breath.   Cardiovascular: Negative.  Negative for chest pain, palpitations and leg swelling.  Gastrointestinal: Negative.  Negative for abdominal pain, constipation, diarrhea, nausea and vomiting.  Genitourinary: Negative for dysuria, frequency and urgency.  Musculoskeletal: Negative for back pain and falls.  Skin: Negative.   Negative for rash.  Neurological: Negative.  Negative for weakness and headaches.  Endo/Heme/Allergies: Negative.  Does not bruise/bleed easily.  Psychiatric/Behavioral: Negative.  Negative for depression. The patient is not nervous/anxious and does not have insomnia.     PAST MEDICAL HISTORY :  Past Medical History:  Diagnosis Date  . Anemia   . Arthritis   . Chronic kidney disease   . GERD (gastroesophageal reflux disease)   . Non Hodgkin's lymphoma ([place])     PAST SURGICAL HISTORY :   Past Surgical History:  Procedure Laterality Date  . BONE MARROW BIOPSY    . Left eye surgery  2012  . Left knee surgery  1981   ACL repair    FAMILY HISTORY :   Family History  Problem Relation Age of Onset  . Pancreatic cancer Mother 64  . Prostate cancer Brother 69  . Prostate cancer Father 37    SOCIAL HISTORY:   Social History   Tobacco Use  . Smoking status: Current Every Day Smoker    Packs/day: 0.25    Years: 10.00    Pack years: 2.50    Types: Cigarettes  . Smokeless tobacco: Never Used  . Tobacco comment: "I only smoke in the bar on occasion"  Substance Use Topics  . Alcohol use: Yes    Alcohol/week: 0.0 oz    Comment: consumes alcohol on the weekend.  . Drug use: No    ALLERGIES:  is allergic to no known allergies.  MEDICATIONS:  Current Outpatient Medications  Medication Sig Dispense Refill  . pantoprazole (PROTONIX) 40 MG tablet Take 40 mg by mouth daily.   0  . benzonatate (TESSALON) 200 MG capsule Take  1 capsule (200 mg total) by mouth 3 (three) times daily as needed for cough. (Patient not taking: Reported on 09/28/2017) 20 capsule 0   No current facility-administered medications for this visit.     PHYSICAL EXAMINATION: ECOG PERFORMANCE STATUS: 0 - Asymptomatic  BP 132/83 (BP Location: Left Arm, Patient Position: Sitting)   Pulse 83   Temp 97.9 F (36.6 C) (Tympanic)   Resp 20   Ht _0  (1.676 m)   Wt 161 lb 3.2 oz (73.1 kg)   BMI 26.02 kg/m    Filed Weights   09/28/17 1051  Weight: 161 lb 3.2 oz (73.1 kg)    Physical Exam  Constitutional: He is oriented to person, place, and time and well-developed, well-nourished, and in no distress. Vital signs are normal.  HENT:  Head: Normocephalic and atraumatic.  Eyes: Pupils are equal, round, and reactive to light.  Neck: Normal range of motion.  Cardiovascular: Normal rate, regular rhythm and normal heart sounds.  No murmur heard. Pulmonary/Chest: Effort normal and breath sounds normal. He has no wheezes.  Abdominal: Soft. Normal appearance and bowel sounds are normal. He exhibits no distension. There is no tenderness.  Musculoskeletal: Normal range of motion. He exhibits no edema.  Neurological: He is alert and oriented to person, place, and time. Gait normal.  Skin: Skin is warm and dry. No rash noted.  Psychiatric: Mood, memory, affect and judgment normal.    LABORATORY DATA:  I have reviewed the data as listed    Component Value Date/Time   NA 139 09/21/2017 0837   NA 139 11/08/2013 0824   K 3.5 09/21/2017 0837   K 4.3 11/08/2013 0824   CL 109 09/21/2017 0837   CL 104 11/08/2013 0824   CO2 22 09/21/2017 0837   CO2 27 11/08/2013 0824   GLUCOSE 95 09/21/2017 0837   GLUCOSE 92 11/08/2013 0824   BUN 16 09/21/2017 0837   BUN 18 11/08/2013 0824   CREATININE 1.32 (H) 09/21/2017 0837   CREATININE 1.85 (H) 04/25/2014 0840   CALCIUM 9.1 09/21/2017 0837   CALCIUM 8.8 11/08/2013 0824   PROT 7.1 09/21/2017 0837   PROT 6.6 02/28/2014 0902   ALBUMIN 4.0 09/21/2017 0837   ALBUMIN 3.5 02/28/2014 0902   AST 21 09/21/2017 0837   AST 14 (L) 02/28/2014 0902   ALT 17 09/21/2017 0837   ALT 19 02/28/2014 0902   ALKPHOS 53 09/21/2017 0837   ALKPHOS 53 02/28/2014 0902   BILITOT 1.0 09/21/2017 0837   BILITOT 0.3 02/28/2014 0902   GFRNONAA 56 (L) 09/21/2017 0837   GFRNONAA 40 (L) 04/25/2014 0840   GFRNONAA 46 (L) 11/08/2013 0824   GFRAA >60 09/21/2017 0837   GFRAA 48 (L)  04/25/2014 0840   GFRAA 53 (L) 11/08/2013 0824    No results found for: SPEP, UPEP  Lab Results  Component Value Date   WBC 7.8 09/21/2017   NEUTROABS 5.3 09/21/2017   HGB 12.9 (L) 09/21/2017   HCT 37.9 (L) 09/21/2017   MCV 87.5 09/21/2017   PLT 253 09/21/2017      Chemistry      Component Value Date/Time   NA 139 09/21/2017 0837   NA 139 11/08/2013 0824   K 3.5 09/21/2017 0837   K 4.3 11/08/2013 0824   CL 109 09/21/2017 0837   CL 104 11/08/2013 0824   CO2 22 09/21/2017 0837   CO2 27 11/08/2013 0824   BUN 16 09/21/2017 0837   BUN 18 11/08/2013 0824   CREATININE  1.32 (H) 09/21/2017 0837   CREATININE 1.85 (H) 04/25/2014 0840      Component Value Date/Time   CALCIUM 9.1 09/21/2017 0837   CALCIUM 8.8 11/08/2013 0824   ALKPHOS 53 09/21/2017 0837   ALKPHOS 53 02/28/2014 0902   AST 21 09/21/2017 0837   AST 14 (L) 02/28/2014 0902   ALT 17 09/21/2017 0837   ALT 19 02/28/2014 0902   BILITOT 1.0 09/21/2017 0837   BILITOT 0.3 02/28/2014 0902       ASSESSMENT & PLAN:   Malignant lymphoma, 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  CT 1/17 showed no evidence of disease.  Clinically no evidence of recurrence therefore we do not need scans at this time.  He denies any new lumps or bumps.  MGUS: Hemoglobin today is 12.9.  Previously 12.5.  M protein 0.4 gm/dl which is stable.  Denies bony pains, calcium WNL, anemia stable and improving and kidney function stable and improving.  CKD: Creatinine 1.32 which is improved.  Continue to monitor.  RTC with labs in 6 months and MD assessment.     Jacquelin Hawking, NP 09/28/2017 11:07 AM

## 2017-09-28 NOTE — Assessment & Plan Note (Addendum)
#    Malignant lymphoplasmacytic lymphoma-stage IV low-grade-completed maintenance Rituxan 11/16.  CT 1/17 showed no evidence of disease.  Clinically no evidence of recurrence therefore we do not need scans at this time.  He denies any new lumps or bumps.  MGUS: Hemoglobin today is 12.9.  Previously 12.5.  M protein 0.4 gm/dl which is stable.  Denies bony pains, calcium WNL, anemia stable and improving and kidney function stable and improving.  CKD: Creatinine 1.32 which is improved.  Continue to monitor.  RTC with labs in 6 months and MD assessment.  

## 2018-03-29 ENCOUNTER — Other Ambulatory Visit: Payer: Self-pay | Admitting: Internal Medicine

## 2018-03-29 DIAGNOSIS — C8300 Small cell B-cell lymphoma, unspecified site: Secondary | ICD-10-CM

## 2018-03-30 ENCOUNTER — Inpatient Hospital Stay: Payer: BLUE CROSS/BLUE SHIELD

## 2018-03-30 ENCOUNTER — Inpatient Hospital Stay: Payer: BLUE CROSS/BLUE SHIELD | Admitting: Internal Medicine

## 2018-03-30 NOTE — Assessment & Plan Note (Signed)
#    Malignant lymphoplasmacytic lymphoma-stage IV low-grade-completed maintenance Rituxan 11/16.  CT 1/17 showed no evidence of disease.  Clinically no evidence of recurrence therefore we do not need scans at this time.  He denies any new lumps or bumps.  MGUS: Hemoglobin today is 12.9.  Previously 12.5.  M protein 0.4 gm/dl which is stable.  Denies bony pains, calcium WNL, anemia stable and improving and kidney function stable and improving.  CKD: Creatinine 1.32 which is improved.  Continue to monitor.  RTC with labs in 6 months and MD assessment.

## 2018-03-30 NOTE — Progress Notes (Unsigned)
[place] OFFICE PROGRESS NOTE  Patient Care Team: Center, [facility] as PCP - General   SUMMARY OF ONCOLOGIC HISTORY:  . 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 CT [non-contrast]- January 2017- NED  # CKD [stage III creat 1.8]     Malignant lymphoma, lymphoplasmacytic ([place])     INTERVAL HISTORY:  A very pleasant 62 year old male patient with above history of stage IV lymphoplasmacytic lymphoma/marginal zone lymphoma currently status post maintenance rituximab [last treatment November 2016].  Patient continues to work full-time. Appetite is good.  Patient denies any tingling and numbness of his extremities. No headaches. No weight loss no night sweats no fevers. No lumps or bumps. No unusual fatigue.no chest pain or shortness of breath or cough. No recent infections.    REVIEW OF SYSTEMS:  A complete 10 point review of system is done which is negative except mentioned above/history of present illness.   PAST MEDICAL HISTORY :  Past Medical History:  Diagnosis Date  . Anemia   . Arthritis   . Chronic kidney disease   . GERD (gastroesophageal reflux disease)   . Non Hodgkin's lymphoma ([place])     PAST SURGICAL HISTORY :   Past Surgical History:  Procedure Laterality Date  . BONE MARROW BIOPSY    . Left eye surgery  2012  . Left knee surgery  1981   ACL repair    FAMILY HISTORY :   Family History  Problem Relation Age of Onset  . Pancreatic cancer Mother 37  . Prostate cancer Brother 86  . Prostate cancer Father 62    SOCIAL HISTORY:   Social History   Tobacco Use  . Smoking status: Current Every Day Smoker    Packs/day: 0.25    Years: 10.00    Pack years:  2.50    Types: Cigarettes  . Smokeless tobacco: Never Used  . Tobacco comment: "I only smoke in the bar on occasion"  Substance Use Topics  . Alcohol use: Yes    Alcohol/week: 0.0 standard drinks    Comment: consumes alcohol on the weekend.  . Drug use: No    ALLERGIES:  is allergic to no known allergies.  MEDICATIONS:  Current Outpatient Medications  Medication Sig Dispense Refill  . benzonatate (TESSALON) 200 MG capsule Take 1 capsule (200 mg total) by mouth 3 (three) times daily as needed for cough. (Patient not taking: Reported on 09/28/2017) 20 capsule 0  . pantoprazole (PROTONIX) 40 MG tablet Take 40 mg by mouth daily.   0   No current facility-administered medications for this visit.     PHYSICAL EXAMINATION: ECOG PERFORMANCE STATUS: 0 - Asymptomatic  There were no vitals taken for this visit.  There were no vitals filed for this visit.  GENERAL: Well-nourished well-developed; Alert, no distress and comfortable. He is Accompanied by family. EYES: no pallor or icterus OROPHARYNX: no thrush or ulceration; good dentition  NECK: supple, no masses felt LYMPH:  no palpable lymphadenopathy in the cervical, axillary or inguinal regions LUNGS: clear to auscultation and  No wheeze or crackles HEART/CVS: regular rate & rhythm and no murmurs; No lower extremity edema ABDOMEN:abdomen soft, non-tender and normal bowel sounds Musculoskeletal:no cyanosis of digits and no clubbing  PSYCH: alert & oriented  x 3 with fluent speech NEURO: no focal motor/sensory deficits SKIN:  no rashes or significant lesions  LABORATORY DATA:  I have reviewed the data as listed    Component Value Date/Time   NA 139 09/21/2017 0837   NA 139 11/08/2013 0824   K 3.5 09/21/2017 0837   K 4.3 11/08/2013 0824   CL 109 09/21/2017 0837   CL 104 11/08/2013 0824   CO2 22 09/21/2017 0837   CO2 27 11/08/2013 0824   GLUCOSE 95 09/21/2017 0837   GLUCOSE 92 11/08/2013 0824   BUN 16 09/21/2017 0837   BUN 18  11/08/2013 0824   CREATININE 1.32 (H) 09/21/2017 0837   CREATININE 1.85 (H) 04/25/2014 0840   CALCIUM 9.1 09/21/2017 0837   CALCIUM 8.8 11/08/2013 0824   PROT 7.1 09/21/2017 0837   PROT 6.6 02/28/2014 0902   ALBUMIN 4.0 09/21/2017 0837   ALBUMIN 3.5 02/28/2014 0902   AST 21 09/21/2017 0837   AST 14 (L) 02/28/2014 0902   ALT 17 09/21/2017 0837   ALT 19 02/28/2014 0902   ALKPHOS 53 09/21/2017 0837   ALKPHOS 53 02/28/2014 0902   BILITOT 1.0 09/21/2017 0837   BILITOT 0.3 02/28/2014 0902   GFRNONAA 56 (L) 09/21/2017 0837   GFRNONAA 40 (L) 04/25/2014 0840   GFRNONAA 46 (L) 11/08/2013 0824   GFRAA >60 09/21/2017 0837   GFRAA 48 (L) 04/25/2014 0840   GFRAA 53 (L) 11/08/2013 0824    No results found for: SPEP, UPEP  Lab Results  Component Value Date   WBC 7.8 09/21/2017   NEUTROABS 5.3 09/21/2017   HGB 12.9 (L) 09/21/2017   HCT 37.9 (L) 09/21/2017   MCV 87.5 09/21/2017   PLT 253 09/21/2017      Chemistry      Component Value Date/Time   NA 139 09/21/2017 0837   NA 139 11/08/2013 0824   K 3.5 09/21/2017 0837   K 4.3 11/08/2013 0824   CL 109 09/21/2017 0837   CL 104 11/08/2013 0824   CO2 22 09/21/2017 0837   CO2 27 11/08/2013 0824   BUN 16 09/21/2017 0837   BUN 18 11/08/2013 0824   CREATININE 1.32 (H) 09/21/2017 0837   CREATININE 1.85 (H) 04/25/2014 0840      Component Value Date/Time   CALCIUM 9.1 09/21/2017 0837   CALCIUM 8.8 11/08/2013 0824   ALKPHOS 53 09/21/2017 0837   ALKPHOS 53 02/28/2014 0902   AST 21 09/21/2017 0837   AST 14 (L) 02/28/2014 0902   ALT 17 09/21/2017 0837   ALT 19 02/28/2014 0902   BILITOT 1.0 09/21/2017 0837   BILITOT 0.3 02/28/2014 0902       ASSESSMENT & PLAN:   No problem-specific Assessment & Plan notes found for this encounter.    Cammie Sickle, MD 03/30/2018 12:55 PM

## 2018-04-13 ENCOUNTER — Inpatient Hospital Stay: Payer: BLUE CROSS/BLUE SHIELD | Admitting: Internal Medicine

## 2018-04-13 ENCOUNTER — Inpatient Hospital Stay: Payer: BLUE CROSS/BLUE SHIELD

## 2018-04-18 ENCOUNTER — Inpatient Hospital Stay: Payer: BLUE CROSS/BLUE SHIELD | Admitting: Internal Medicine

## 2018-04-18 ENCOUNTER — Inpatient Hospital Stay: Payer: BLUE CROSS/BLUE SHIELD

## 2018-05-09 ENCOUNTER — Other Ambulatory Visit: Payer: Self-pay

## 2018-05-09 ENCOUNTER — Inpatient Hospital Stay: Payer: Managed Care, Other (non HMO) | Attending: Internal Medicine

## 2018-05-09 ENCOUNTER — Encounter: Payer: Self-pay | Admitting: *Deleted

## 2018-05-09 ENCOUNTER — Encounter: Payer: Self-pay | Admitting: Internal Medicine

## 2018-05-09 ENCOUNTER — Inpatient Hospital Stay (HOSPITAL_BASED_OUTPATIENT_CLINIC_OR_DEPARTMENT_OTHER): Payer: Managed Care, Other (non HMO) | Admitting: Internal Medicine

## 2018-05-09 VITALS — BP 121/79 | HR 72 | Temp 97.4°F | Resp 16 | Wt 157.0 lb

## 2018-05-09 DIAGNOSIS — C8300 Small cell B-cell lymphoma, unspecified site: Secondary | ICD-10-CM | POA: Diagnosis not present

## 2018-05-09 DIAGNOSIS — F17210 Nicotine dependence, cigarettes, uncomplicated: Secondary | ICD-10-CM | POA: Insufficient documentation

## 2018-05-09 DIAGNOSIS — N183 Chronic kidney disease, stage 3 (moderate): Secondary | ICD-10-CM | POA: Diagnosis not present

## 2018-05-09 DIAGNOSIS — Z79899 Other long term (current) drug therapy: Secondary | ICD-10-CM | POA: Diagnosis not present

## 2018-05-09 DIAGNOSIS — D472 Monoclonal gammopathy: Secondary | ICD-10-CM | POA: Diagnosis not present

## 2018-05-09 DIAGNOSIS — D509 Iron deficiency anemia, unspecified: Secondary | ICD-10-CM | POA: Diagnosis not present

## 2018-05-09 LAB — CBC WITH DIFFERENTIAL/PLATELET
Abs Immature Granulocytes: 0 10*3/uL (ref 0.00–0.07)
Basophils Absolute: 0.1 10*3/uL (ref 0.0–0.1)
Basophils Relative: 1 %
Eosinophils Absolute: 0.1 10*3/uL (ref 0.0–0.5)
Eosinophils Relative: 2 %
HEMATOCRIT: 37.4 % — AB (ref 39.0–52.0)
Hemoglobin: 12.1 g/dL — ABNORMAL LOW (ref 13.0–17.0)
Immature Granulocytes: 0 %
Lymphocytes Relative: 28 %
Lymphs Abs: 1.7 10*3/uL (ref 0.7–4.0)
MCH: 29 pg (ref 26.0–34.0)
MCHC: 32.4 g/dL (ref 30.0–36.0)
MCV: 89.7 fL (ref 80.0–100.0)
Monocytes Absolute: 0.4 10*3/uL (ref 0.1–1.0)
Monocytes Relative: 6 %
Neutro Abs: 3.8 10*3/uL (ref 1.7–7.7)
Neutrophils Relative %: 63 %
Platelets: 254 10*3/uL (ref 150–400)
RBC: 4.17 MIL/uL — ABNORMAL LOW (ref 4.22–5.81)
RDW: 15 % (ref 11.5–15.5)
WBC: 6 10*3/uL (ref 4.0–10.5)
nRBC: 0 % (ref 0.0–0.2)

## 2018-05-09 LAB — COMPREHENSIVE METABOLIC PANEL
ALT: 17 U/L (ref 0–44)
AST: 26 U/L (ref 15–41)
Albumin: 3.9 g/dL (ref 3.5–5.0)
Alkaline Phosphatase: 50 U/L (ref 38–126)
Anion gap: 6 (ref 5–15)
BUN: 14 mg/dL (ref 8–23)
CHLORIDE: 109 mmol/L (ref 98–111)
CO2: 23 mmol/L (ref 22–32)
CREATININE: 1.5 mg/dL — AB (ref 0.61–1.24)
Calcium: 9.3 mg/dL (ref 8.9–10.3)
GFR calc Af Amer: 57 mL/min — ABNORMAL LOW (ref >60)
GFR, EST NON AFRICAN AMERICAN: 49 mL/min — AB (ref >60)
Glucose, Bld: 160 mg/dL — ABNORMAL HIGH (ref 70–99)
Potassium: 3.4 mmol/L — ABNORMAL LOW (ref 3.5–5.1)
SODIUM: 138 mmol/L (ref 135–145)
Total Bilirubin: 0.6 mg/dL (ref 0.3–1.2)
Total Protein: 7 g/dL (ref 6.5–8.1)

## 2018-05-09 LAB — FERRITIN: Ferritin: 84 ng/mL (ref 24–336)

## 2018-05-09 LAB — IRON AND TIBC
Iron: 84 ug/dL (ref 45–182)
Saturation Ratios: 31 % (ref 17.9–39.5)
TIBC: 268 ug/dL (ref 250–450)
UIBC: 184 ug/dL

## 2018-05-09 NOTE — Assessment & Plan Note (Addendum)
#    Malignant lymphoplasmacytic lymphoma-stage IV low-grade-completed maintenance Rituxan 11/16.  CT 1/17 showed no evidence of disease.  Clinically stable notice of recurrence.  Continue surveillance with CAT scans.  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  # Mild anemia-hemoglobin 12.1.  Likely CKD.  Check iron studies today; recommend PO iron.  Recommend screening colonoscopy.  Referred to GI.  # CKD: Creatinine 1.32 which is improved.  Continue to monitor.  # DISPOSITION:add iron studies/ ferritin today.  # refer to [facility] re: screening colonoscopy # follow up in 6 months-MD/labs cbc/cmp/M- protein/ lkappa-lamda light chains-Dr.B

## 2018-05-09 NOTE — Progress Notes (Signed)
[place] OFFICE PROGRESS NOTE  Patient Care Team: Center, [facility] as PCP - General   SUMMARY OF ONCOLOGIC HISTORY:  . 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 CT [non-contrast]- January 2017- NED  # CKD [stage III creat 1.8]  DIAGNOSIS: lymphoplasmacytic lymphoma/[place]  STAGE:   IV      ;GOALS: control  CURRENT/MOST RECENT THERAPY: surveillaince     Malignant lymphoma, lymphoplasmacytic ([facility])     INTERVAL HISTORY:  A very pleasant 63 year old male patient with above history of stage IV lymphoplasmacytic lymphoma/marginal zone lymphoma currently status post maintenance rituximab [last treatment November 2016].  Patient continues to work full-time.  Denies any aches and pains.  No lumps or bumps.  No recent infections.  No blood in stools or black or stools.  Denies having a colonoscopy.   Review of Systems  Constitutional: Negative for chills, diaphoresis, fever, malaise/fatigue and weight loss.  HENT: Negative for nosebleeds and sore throat.   Eyes: Negative for double vision.  Respiratory: Negative for cough, hemoptysis, sputum production, shortness of breath and wheezing.   Cardiovascular: Negative for chest pain, palpitations, orthopnea and leg swelling.  Gastrointestinal: Negative for abdominal pain, blood in stool, constipation, diarrhea, heartburn, melena, nausea and vomiting.  Genitourinary: Negative for dysuria, frequency and urgency.  Musculoskeletal: Negative for back pain and joint pain.  Skin: Negative.  Negative for itching and rash.  Neurological: Negative for dizziness, tingling, focal weakness, weakness and headaches.  Endo/Heme/Allergies: Does  not bruise/bleed easily.  Psychiatric/Behavioral: Negative for depression. The patient is not nervous/anxious and does not have insomnia.      PAST MEDICAL HISTORY :  Past Medical History:  Diagnosis Date  . Anemia   . Arthritis   . Chronic kidney disease   . GERD (gastroesophageal reflux disease)   . Non Hodgkin's lymphoma ([place])     PAST SURGICAL HISTORY :   Past Surgical History:  Procedure Laterality Date  . BONE MARROW BIOPSY    . Left eye surgery  2012  . Left knee surgery  1981   ACL repair    FAMILY HISTORY :   Family History  Problem Relation Age of Onset  . Pancreatic cancer Mother 74  . Prostate cancer Brother 38  . Prostate cancer Father 64    SOCIAL HISTORY:   Social History   Tobacco Use  . Smoking status: Current Every Day Smoker    Packs/day: 0.25    Years: 10.00    Pack years: 2.50    Types: Cigarettes  . Smokeless tobacco: Never Used  . Tobacco comment: "I only smoke in the bar on occasion"  Substance Use Topics  . Alcohol use: Yes    Alcohol/week: 0.0 standard drinks    Comment: consumes alcohol on the weekend.  . Drug use: No    ALLERGIES:  is allergic to no known allergies.  MEDICATIONS:  Current Outpatient Medications  Medication Sig Dispense Refill  . pantoprazole (PROTONIX) 40 MG tablet Take 40 mg by mouth daily.   0   No current facility-administered medications for this visit.     PHYSICAL EXAMINATION: ECOG PERFORMANCE STATUS: 0 - Asymptomatic  BP 121/79 (BP Location: Left Arm, Patient Position: Sitting, Cuff Size: Normal)   Pulse 72  Temp (!) 97.4 F (36.3 C) (Tympanic)   Resp 16   Wt 157 lb (71.2 kg)   BMI 25.34 kg/m   Filed Weights   05/09/18 1430  Weight: 157 lb (71.2 kg)    Physical Exam  Constitutional: He is oriented to person, place, and time and well-developed, well-nourished, and in no distress.  He is alone.   HENT:  Head: Normocephalic and atraumatic.  Mouth/Throat: Oropharynx is clear and moist.  No oropharyngeal exudate.  Eyes: Pupils are equal, round, and reactive to light.  Neck: Normal range of motion. Neck supple.  Cardiovascular: Normal rate and regular rhythm.  Pulmonary/Chest: No respiratory distress. He has no wheezes.  Abdominal: Soft. Bowel sounds are normal. He exhibits no distension and no mass. There is no abdominal tenderness. There is no rebound and no guarding.  Musculoskeletal: Normal range of motion.        General: No tenderness or edema.  Neurological: He is alert and oriented to person, place, and time.  Skin: Skin is warm.  Psychiatric: Affect normal.    LABORATORY DATA:  I have reviewed the data as listed    Component Value Date/Time   NA 138 05/09/2018 1355   NA 139 11/08/2013 0824   K 3.4 (L) 05/09/2018 1355   K 4.3 11/08/2013 0824   CL 109 05/09/2018 1355   CL 104 11/08/2013 0824   CO2 23 05/09/2018 1355   CO2 27 11/08/2013 0824   GLUCOSE 160 (H) 05/09/2018 1355   GLUCOSE 92 11/08/2013 0824   BUN 14 05/09/2018 1355   BUN 18 11/08/2013 0824   CREATININE 1.50 (H) 05/09/2018 1355   CREATININE 1.85 (H) 04/25/2014 0840   CALCIUM 9.3 05/09/2018 1355   CALCIUM 8.8 11/08/2013 0824   PROT 7.0 05/09/2018 1355   PROT 6.6 02/28/2014 0902   ALBUMIN 3.9 05/09/2018 1355   ALBUMIN 3.5 02/28/2014 0902   AST 26 05/09/2018 1355   AST 14 (L) 02/28/2014 0902   ALT 17 05/09/2018 1355   ALT 19 02/28/2014 0902   ALKPHOS 50 05/09/2018 1355   ALKPHOS 53 02/28/2014 0902   BILITOT 0.6 05/09/2018 1355   BILITOT 0.3 02/28/2014 0902   GFRNONAA 49 (L) 05/09/2018 1355   GFRNONAA 40 (L) 04/25/2014 0840   GFRNONAA 46 (L) 11/08/2013 0824   GFRAA 57 (L) 05/09/2018 1355   GFRAA 48 (L) 04/25/2014 0840   GFRAA 53 (L) 11/08/2013 0824    No results found for: SPEP, UPEP  Lab Results  Component Value Date   WBC 6.0 05/09/2018   NEUTROABS 3.8 05/09/2018   HGB 12.1 (L) 05/09/2018   HCT 37.4 (L) 05/09/2018   MCV 89.7 05/09/2018   PLT 254 05/09/2018      Chemistry       Component Value Date/Time   NA 138 05/09/2018 1355   NA 139 11/08/2013 0824   K 3.4 (L) 05/09/2018 1355   K 4.3 11/08/2013 0824   CL 109 05/09/2018 1355   CL 104 11/08/2013 0824   CO2 23 05/09/2018 1355   CO2 27 11/08/2013 0824   BUN 14 05/09/2018 1355   BUN 18 11/08/2013 0824   CREATININE 1.50 (H) 05/09/2018 1355   CREATININE 1.85 (H) 04/25/2014 0840      Component Value Date/Time   CALCIUM 9.3 05/09/2018 1355   CALCIUM 8.8 11/08/2013 0824   ALKPHOS 50 05/09/2018 1355   ALKPHOS 53 02/28/2014 0902   AST 26 05/09/2018 1355   AST 14 (L) 02/28/2014 0902  ALT 17 05/09/2018 1355   ALT 19 02/28/2014 0902   BILITOT 0.6 05/09/2018 1355   BILITOT 0.3 02/28/2014 0902       ASSESSMENT & PLAN:   Malignant lymphoma, lymphoplasmacytic ([place]) #  Malignant lymphoplasmacytic lymphoma-stage IV low-grade-completed maintenance Rituxan 11/16.  CT 1/17 showed no evidence of disease.  Clinically stable notice of recurrence.  Continue surveillance with CAT scans.  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  # Mild anemia-hemoglobin 12.1.  Likely CKD.  Check iron studies today; recommend PO iron.  Recommend screening colonoscopy.  Referred to GI.  # CKD: Creatinine 1.32 which is improved.  Continue to monitor.  # DISPOSITION:add iron studies/ ferritin today.  # refer to [facility] re: screening colonoscopy # follow up in 6 months-MD/labs cbc/cmp/M- protein/ lkappa-lamda light chains-Dr.B     Cammie Sickle, MD 05/10/2018 8:45 AM

## 2018-05-10 LAB — KAPPA/LAMBDA LIGHT CHAINS
Kappa free light chain: 62.4 mg/L — ABNORMAL HIGH (ref 3.3–19.4)
Kappa, lambda light chain ratio: 3.63 — ABNORMAL HIGH (ref 0.26–1.65)
LAMDA FREE LIGHT CHAINS: 17.2 mg/L (ref 5.7–26.3)

## 2018-05-11 LAB — MULTIPLE MYELOMA PANEL, SERUM
ALBUMIN SERPL ELPH-MCNC: 3.8 g/dL (ref 2.9–4.4)
Albumin/Glob SerPl: 1.6 (ref 0.7–1.7)
Alpha 1: 0.2 g/dL (ref 0.0–0.4)
Alpha2 Glob SerPl Elph-Mcnc: 0.5 g/dL (ref 0.4–1.0)
B-GLOBULIN SERPL ELPH-MCNC: 0.8 g/dL (ref 0.7–1.3)
Gamma Glob SerPl Elph-Mcnc: 0.9 g/dL (ref 0.4–1.8)
Globulin, Total: 2.5 g/dL (ref 2.2–3.9)
IgA: 170 mg/dL (ref 61–437)
IgG (Immunoglobin G), Serum: 813 mg/dL (ref 700–1600)
IgM (Immunoglobulin M), Srm: 361 mg/dL — ABNORMAL HIGH (ref 20–172)
M Protein SerPl Elph-Mcnc: 0.3 g/dL — ABNORMAL HIGH
TOTAL PROTEIN ELP: 6.3 g/dL (ref 6.0–8.5)

## 2018-06-11 ENCOUNTER — Encounter: Payer: Self-pay | Admitting: Gastroenterology

## 2018-06-11 ENCOUNTER — Ambulatory Visit: Payer: Managed Care, Other (non HMO) | Admitting: Gastroenterology

## 2018-06-11 NOTE — Progress Notes (Deleted)
He has been referred for a screening colonoscopy. He has a history of stage IV lymphoplasmacytic lymphoma/marginal zone lymphoma currently status post maintenance rituximab .    For a screening colonoscopy.   Plan  1. Colonoscopy

## 2018-07-24 ENCOUNTER — Ambulatory Visit: Payer: Managed Care, Other (non HMO) | Admitting: Gastroenterology

## 2018-07-25 ENCOUNTER — Encounter (INDEPENDENT_AMBULATORY_CARE_PROVIDER_SITE_OTHER): Payer: Managed Care, Other (non HMO) | Admitting: Gastroenterology

## 2018-09-26 ENCOUNTER — Encounter: Payer: Self-pay | Admitting: Gastroenterology

## 2018-09-26 ENCOUNTER — Other Ambulatory Visit: Payer: Self-pay

## 2018-09-26 ENCOUNTER — Ambulatory Visit (INDEPENDENT_AMBULATORY_CARE_PROVIDER_SITE_OTHER): Payer: Managed Care, Other (non HMO) | Admitting: Gastroenterology

## 2018-09-26 VITALS — BP 121/81 | HR 67 | Temp 97.9°F | Ht 66.0 in | Wt 161.0 lb

## 2018-09-26 DIAGNOSIS — Z1211 Encounter for screening for malignant neoplasm of colon: Secondary | ICD-10-CM

## 2018-09-26 DIAGNOSIS — D649 Anemia, unspecified: Secondary | ICD-10-CM

## 2018-09-26 DIAGNOSIS — F17200 Nicotine dependence, unspecified, uncomplicated: Secondary | ICD-10-CM

## 2018-09-26 NOTE — Progress Notes (Signed)
Jonathon Bellows MD, MRCP(U.K) [address]  [place]  [place], [place] [address]  Main: [phone]  Fax: [phone]   Gastroenterology Consultation  Referring Provider:     Cammie Sickle * Primary Care Physician:  Center, [facility] Primary Gastroenterologist:  Dr. Jonathon Bellows  Reason for Consultation: Iron deficiency anemia        HPI:   Terry Douglas is a 63 y.o. y/o male referred for consultation & management  by Dr. Domingo Madeira, [facility].    He is a patient of Dr. Rogue Bussing and has a diagnosis of stage IV lymphoplasmacytic lymphoma versus marginal zone lymphoma.  Diagnosed in September 2014.  Has undergone chemotherapy.  He is undergoing surveillance.  He is mildly anemic which has been stable for the past 2 years at 12.1 g with an MCV of 89.7.  In January 2020  ferritin was 84 and iron studies were normal.  Dr. Rogue Bussing referred him for a screening colonoscopy.  He has never had a colonoscopy . He has no family history of colon cancer. Smoked for 5 years.  No GI symptoms of bleeding , constipation, change in bowel habits. Regular `bowel movement .  Past Medical History:  Diagnosis Date  . Anemia   . Arthritis   . Chronic kidney disease   . GERD (gastroesophageal reflux disease)   . Non Hodgkin's lymphoma [facility])     Past Surgical History:  Procedure Laterality Date  . BONE MARROW BIOPSY    . Left eye surgery  2012  . Left knee surgery  1981   ACL repair    Prior to Admission medications   Medication Sig Start Date End Date Taking? Authorizing Provider  pantoprazole (PROTONIX) 40 MG tablet Take 40 mg by mouth daily.  09/19/14   [provider]    Family History  Problem Relation Age of Onset  . Pancreatic cancer Mother 22  . Prostate cancer Brother 17  . Prostate cancer Father 98     Social History   Tobacco Use  . Smoking status: Current Every Day Smoker    Packs/day: 0.25    Years: 10.00   Pack years: 2.50    Types: Cigarettes  . Smokeless tobacco: Never Used  . Tobacco comment: "I only smoke in the bar on occasion"  Substance Use Topics  . Alcohol use: Yes    Alcohol/week: 0.0 standard drinks    Comment: consumes alcohol on the weekend.  . Drug use: No    Allergies as of 09/26/2018 - Review Complete 07/25/2018  Allergen Reaction Noted  . No known allergies  10/17/2014    Review of Systems:    All systems reviewed and negative except where noted in HPI.   Physical Exam:  There were no vitals taken for this visit. No LMP for male patient. Psych:  Alert and cooperative. Normal mood and affect. General:   Alert,  Well-developed, well-nourished, pleasant and cooperative in NAD Head:  Normocephalic and atraumatic. Eyes:  Sclera clear, no icterus.   Conjunctiva pink. Ears:  Normal auditory acuity. Nose:  No deformity, discharge, or lesions. Mouth:  No deformity or lesions,oropharynx pink & moist. Neck:  Supple; no masses or thyromegaly. Lungs:  Respirations even and unlabored.  Clear throughout to auscultation.   No wheezes, crackles, or rhonchi. No acute distress. Heart:  Regular rate and rhythm; no murmurs, clicks, rubs, or gallops. Abdomen:  Normal bowel sounds.  No bruits.  Soft, non-tender and non-distended without masses, hepatosplenomegaly  or hernias noted.  No guarding or rebound tenderness.    Neurologic:  Alert and oriented x3;  grossly normal neurologically. Skin:  Intact without significant lesions or rashes. No jaundice. Lymph Nodes:  No significant cervical adenopathy. Psych:  Alert and cooperative. Normal mood and affect.  Imaging Studies: No results found.  Assessment and Plan:   Terry Douglas is a 63 y.o. y/o male has been referred for iron deficiency anemia and screening colonoscopy.  He follows with Dr. Rogue Bussing and is under surveillance for stage IV lymphoplasmacytic lymphoma.  Undergone chemotherapy.  Iron studies do not show a clear iron  deficiency anemia.  Hemoglobin is been stable for over 2 years.  He is a smoker and has advised him to stop smoking as it increases risk of colon cancer in addition to other cancers.  Plan 1.  Screening colonoscopy 2.  Stop smoking.  I have discussed alternative options, risks & benefits,  which include, but are not limited to, bleeding, infection, perforation,respiratory complication & drug reaction.  The patient agrees with this plan & written consent will be obtained.     Follow up PRN.   Dr Jonathon Bellows MD,MRCP(U.K)

## 2018-09-27 ENCOUNTER — Telehealth: Payer: Self-pay | Admitting: Gastroenterology

## 2018-09-27 NOTE — Telephone Encounter (Signed)
Pt is calling stating he is returning someone's call

## 2018-09-27 NOTE — Telephone Encounter (Signed)
Spoke with pt and informed him that we would need to reschedule his colonoscopy due to a schedule change. Pt understands procedure has been rescheduled.

## 2018-10-15 ENCOUNTER — Other Ambulatory Visit
Admission: RE | Admit: 2018-10-15 | Discharge: 2018-10-15 | Disposition: A | Discharge status: Home / Self Care | Payer: Managed Care, Other (non HMO) | Source: Ambulatory Visit | Attending: Gastroenterology | Admitting: Gastroenterology

## 2018-10-16 ENCOUNTER — Telehealth: Payer: Self-pay

## 2018-10-16 NOTE — Telephone Encounter (Signed)
Called pt to reschedule colonoscopy due to pt missing the COVID test yesterday.  Unable to contact, VM not setup

## 2018-10-17 NOTE — Telephone Encounter (Signed)
Called pt again to reschedule colonoscopy procedure.  Unable to contact, VM not set up

## 2018-10-22 ENCOUNTER — Ambulatory Visit: Admit: 2018-10-22 | Payer: Managed Care, Other (non HMO) | Admitting: Gastroenterology

## 2018-10-22 SURGERY — COLONOSCOPY WITH PROPOFOL
Anesthesia: General

## 2018-11-08 ENCOUNTER — Other Ambulatory Visit: Payer: Self-pay

## 2018-11-09 ENCOUNTER — Other Ambulatory Visit: Payer: Self-pay

## 2018-11-09 ENCOUNTER — Encounter: Payer: Self-pay | Admitting: Internal Medicine

## 2018-11-09 ENCOUNTER — Other Ambulatory Visit: Payer: Self-pay | Admitting: *Deleted

## 2018-11-09 ENCOUNTER — Inpatient Hospital Stay: Payer: Managed Care, Other (non HMO) | Attending: Internal Medicine

## 2018-11-09 ENCOUNTER — Inpatient Hospital Stay (HOSPITAL_BASED_OUTPATIENT_CLINIC_OR_DEPARTMENT_OTHER): Payer: Managed Care, Other (non HMO) | Admitting: Internal Medicine

## 2018-11-09 DIAGNOSIS — N183 Chronic kidney disease, stage 3 (moderate): Secondary | ICD-10-CM | POA: Insufficient documentation

## 2018-11-09 DIAGNOSIS — F17210 Nicotine dependence, cigarettes, uncomplicated: Secondary | ICD-10-CM

## 2018-11-09 DIAGNOSIS — Z8042 Family history of malignant neoplasm of prostate: Secondary | ICD-10-CM | POA: Diagnosis not present

## 2018-11-09 DIAGNOSIS — Z79899 Other long term (current) drug therapy: Secondary | ICD-10-CM | POA: Insufficient documentation

## 2018-11-09 DIAGNOSIS — Z800 Family history of malignant neoplasm of digestive organs: Secondary | ICD-10-CM | POA: Diagnosis not present

## 2018-11-09 DIAGNOSIS — M1990 Unspecified osteoarthritis, unspecified site: Secondary | ICD-10-CM

## 2018-11-09 DIAGNOSIS — D649 Anemia, unspecified: Secondary | ICD-10-CM | POA: Insufficient documentation

## 2018-11-09 DIAGNOSIS — D509 Iron deficiency anemia, unspecified: Secondary | ICD-10-CM

## 2018-11-09 DIAGNOSIS — D472 Monoclonal gammopathy: Secondary | ICD-10-CM

## 2018-11-09 DIAGNOSIS — C8300 Small cell B-cell lymphoma, unspecified site: Secondary | ICD-10-CM | POA: Insufficient documentation

## 2018-11-09 LAB — CBC WITH DIFFERENTIAL/PLATELET
Abs Immature Granulocytes: 0.02 10*3/uL (ref 0.00–0.07)
Basophils Absolute: 0.1 10*3/uL (ref 0.0–0.1)
Basophils Relative: 1 %
Eosinophils Absolute: 0.1 10*3/uL (ref 0.0–0.5)
Eosinophils Relative: 1 %
HCT: 39.2 % (ref 39.0–52.0)
Hemoglobin: 13 g/dL (ref 13.0–17.0)
Immature Granulocytes: 0 %
Lymphocytes Relative: 30 %
Lymphs Abs: 1.8 10*3/uL (ref 0.7–4.0)
MCH: 29.3 pg (ref 26.0–34.0)
MCHC: 33.2 g/dL (ref 30.0–36.0)
MCV: 88.5 fL (ref 80.0–100.0)
Monocytes Absolute: 0.6 10*3/uL (ref 0.1–1.0)
Monocytes Relative: 10 %
Neutro Abs: 3.5 10*3/uL (ref 1.7–7.7)
Neutrophils Relative %: 58 %
Platelets: 285 10*3/uL (ref 150–400)
RBC: 4.43 MIL/uL (ref 4.22–5.81)
RDW: 14.7 % (ref 11.5–15.5)
WBC: 6.1 10*3/uL (ref 4.0–10.5)
nRBC: 0 % (ref 0.0–0.2)

## 2018-11-09 LAB — COMPREHENSIVE METABOLIC PANEL
ALT: 16 U/L (ref 0–44)
AST: 20 U/L (ref 15–41)
Albumin: 4 g/dL (ref 3.5–5.0)
Alkaline Phosphatase: 44 U/L (ref 38–126)
Anion gap: 9 (ref 5–15)
BUN: 21 mg/dL (ref 8–23)
CO2: 24 mmol/L (ref 22–32)
Calcium: 9.2 mg/dL (ref 8.9–10.3)
Chloride: 108 mmol/L (ref 98–111)
Creatinine, Ser: 1.49 mg/dL — ABNORMAL HIGH (ref 0.61–1.24)
GFR calc Af Amer: 57 mL/min — ABNORMAL LOW (ref >60)
GFR calc non Af Amer: 49 mL/min — ABNORMAL LOW (ref >60)
Glucose, Bld: 94 mg/dL (ref 70–99)
Potassium: 4.2 mmol/L (ref 3.5–5.1)
Sodium: 141 mmol/L (ref 135–145)
Total Bilirubin: 0.5 mg/dL (ref 0.3–1.2)
Total Protein: 7.1 g/dL (ref 6.5–8.1)

## 2018-11-09 NOTE — Progress Notes (Signed)
[place] OFFICE PROGRESS NOTE  Patient Care Team: Center, [facility] as PCP - General   SUMMARY OF ONCOLOGIC HISTORY:  . 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 CT [non-contrast]- January 2017- NED  # CKD [stage III creat 1.8]  DIAGNOSIS: lymphoplasmacytic lymphoma/Waldenstroms  STAGE:   IV      ;GOALS: control  CURRENT/MOST RECENT THERAPY: surveillaince   Malignant lymphoma, lymphoplasmacytic (HCC)    INTERVAL HISTORY:  A very pleasant 63 year old male patient with above history of stage IV lymphoplasmacytic lymphoma/marginal zone lymphoma currently status post maintenance rituximab [last treatment November 2016].  Patient stated he had his older sister died recently.  Around the same time patient had evaluation with GI for screening colonoscopy.  Patient was quite stressed with the death of his sister and decided to postpone.  Otherwise denies any aches and pains.  No lumps or bumps.  No night sweats.  Review of Systems  Constitutional: Negative for chills, diaphoresis, fever, malaise/fatigue and weight loss.  HENT: Negative for nosebleeds and sore throat.   Eyes: Negative for double vision.  Respiratory: Negative for cough, hemoptysis, sputum production, shortness of breath and wheezing.   Cardiovascular: Negative for chest pain, palpitations, orthopnea and leg swelling.  Gastrointestinal: Negative for abdominal pain, blood in stool, constipation, diarrhea, heartburn, melena, nausea and vomiting.  Genitourinary: Negative for dysuria, frequency and urgency.  Musculoskeletal: Negative for back pain and joint pain.  Skin: Negative.  Negative for itching and  rash.  Neurological: Negative for dizziness, tingling, focal weakness, weakness and headaches.  Endo/Heme/Allergies: Does not bruise/bleed easily.  Psychiatric/Behavioral: Negative for depression. The patient is not nervous/anxious and does not have insomnia.      PAST MEDICAL HISTORY :  Past Medical History:  Diagnosis Date  . Anemia   . Arthritis   . Chronic kidney disease   . GERD (gastroesophageal reflux disease)   . Non Hodgkin's lymphoma ([place])     PAST SURGICAL HISTORY :   Past Surgical History:  Procedure Laterality Date  . BONE MARROW BIOPSY    . Left eye surgery  2012  . Left knee surgery  1981   ACL repair    FAMILY HISTORY :   Family History  Problem Relation Age of Onset  . Pancreatic cancer Mother 29  . Prostate cancer Brother 61  . Prostate cancer Father 67    SOCIAL HISTORY:   Social History   Tobacco Use  . Smoking status: Current Every Day Smoker    Packs/day: 0.25    Years: 10.00    Pack years: 2.50    Types: Cigarettes  . Smokeless tobacco: Never Used  . Tobacco comment: "I only smoke in the bar on occasion"  Substance Use Topics  . Alcohol use: Yes    Alcohol/week: 0.0 standard drinks    Comment: consumes alcohol on the weekend.  . Drug use: No    ALLERGIES:  is allergic to no known allergies.  MEDICATIONS:  Current Outpatient Medications  Medication Sig Dispense Refill  . pantoprazole (PROTONIX) 40 MG tablet Take 40 mg by mouth daily.   0   No current facility-administered medications for this visit.     PHYSICAL EXAMINATION: ECOG PERFORMANCE STATUS: 0 - Asymptomatic  BP  124/80 (BP Location: Right Arm, Patient Position: Sitting)   Pulse 71   Temp 97.7 F (36.5 C) (Tympanic)   Ht '5\' 6"'  (1.676 m)   Wt 159 lb 12.8 oz (72.5 kg)   BMI 25.79 kg/m   Filed Weights   11/09/18 1345  Weight: 159 lb 12.8 oz (72.5 kg)    Physical Exam  Constitutional: He is oriented to person, place, and time and well-developed, well-nourished,  and in no distress.  He is alone.   HENT:  Head: Normocephalic and atraumatic.  Mouth/Throat: Oropharynx is clear and moist. No oropharyngeal exudate.  Eyes: Pupils are equal, round, and reactive to light.  Neck: Normal range of motion. Neck supple.  Cardiovascular: Normal rate and regular rhythm.  Pulmonary/Chest: No respiratory distress. He has no wheezes.  Abdominal: Soft. Bowel sounds are normal. He exhibits no distension and no mass. There is no abdominal tenderness. There is no rebound and no guarding.  Musculoskeletal: Normal range of motion.        General: No tenderness or edema.  Neurological: He is alert and oriented to person, place, and time.  Skin: Skin is warm.  Psychiatric: Affect normal.    LABORATORY DATA:  I have reviewed the data as listed    Component Value Date/Time   NA 141 11/09/2018 1320   NA 139 11/08/2013 0824   K 4.2 11/09/2018 1320   K 4.3 11/08/2013 0824   CL 108 11/09/2018 1320   CL 104 11/08/2013 0824   CO2 24 11/09/2018 1320   CO2 27 11/08/2013 0824   GLUCOSE 94 11/09/2018 1320   GLUCOSE 92 11/08/2013 0824   BUN 21 11/09/2018 1320   BUN 18 11/08/2013 0824   CREATININE 1.49 (H) 11/09/2018 1320   CREATININE 1.85 (H) 04/25/2014 0840   CALCIUM 9.2 11/09/2018 1320   CALCIUM 8.8 11/08/2013 0824   PROT 7.1 11/09/2018 1320   PROT 6.6 02/28/2014 0902   ALBUMIN 4.0 11/09/2018 1320   ALBUMIN 3.5 02/28/2014 0902   AST 20 11/09/2018 1320   AST 14 (L) 02/28/2014 0902   ALT 16 11/09/2018 1320   ALT 19 02/28/2014 0902   ALKPHOS 44 11/09/2018 1320   ALKPHOS 53 02/28/2014 0902   BILITOT 0.5 11/09/2018 1320   BILITOT 0.3 02/28/2014 0902   GFRNONAA 49 (L) 11/09/2018 1320   GFRNONAA 40 (L) 04/25/2014 0840   GFRNONAA 46 (L) 11/08/2013 0824   GFRAA 57 (L) 11/09/2018 1320   GFRAA 48 (L) 04/25/2014 0840   GFRAA 53 (L) 11/08/2013 0824    No results found for: SPEP, UPEP  Lab Results  Component Value Date   WBC 6.1 11/09/2018   NEUTROABS 3.5  11/09/2018   HGB 13.0 11/09/2018   HCT 39.2 11/09/2018   MCV 88.5 11/09/2018   PLT 285 11/09/2018      Chemistry      Component Value Date/Time   NA 141 11/09/2018 1320   NA 139 11/08/2013 0824   K 4.2 11/09/2018 1320   K 4.3 11/08/2013 0824   CL 108 11/09/2018 1320   CL 104 11/08/2013 0824   CO2 24 11/09/2018 1320   CO2 27 11/08/2013 0824   BUN 21 11/09/2018 1320   BUN 18 11/08/2013 0824   CREATININE 1.49 (H) 11/09/2018 1320   CREATININE 1.85 (H) 04/25/2014 0840      Component Value Date/Time   CALCIUM 9.2 11/09/2018 1320   CALCIUM 8.8 11/08/2013 0824   ALKPHOS 44 11/09/2018 1320   ALKPHOS 53 02/28/2014 0902  AST 20 11/09/2018 1320   AST 14 (L) 02/28/2014 0902   ALT 16 11/09/2018 1320   ALT 19 02/28/2014 0902   BILITOT 0.5 11/09/2018 1320   BILITOT 0.3 02/28/2014 0902       ASSESSMENT & PLAN:   Malignant lymphoma, lymphoplasmacytic ([place]) #  Malignant lymphoplasmacytic lymphoma-stage IV low-grade-completed maintenance Rituxan 11/16.  CT 1/17 showed no evidence of disease.  Clinically STABLE; No evidence of recurrence.  Continue surveillance with out CAT scans.  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  # Mild anemia-hemoglobin 13;   Likely CKD. Recommend PO iron every other day.  Discussed regarding constipation.  #Again discussed regarding screening colonoscopy/follow-up with GI.  # CKD: Stage III creatinine 1.5.  Stable.  # DISPOSITION: # follow up in 6 months-MD/labs cbc/cmp/M- protein/ lkappa-lamda light chains-Dr.B     Cammie Sickle, MD 11/09/2018 2:26 PM

## 2018-11-09 NOTE — Assessment & Plan Note (Addendum)
#    Malignant lymphoplasmacytic lymphoma-stage IV low-grade-completed maintenance Rituxan 11/16.  CT 1/17 showed no evidence of disease.  Clinically STABLE; No evidence of recurrence.  Continue surveillance with out CAT scans.  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  # Mild anemia-hemoglobin 13;   Likely CKD. Recommend PO iron every other day.  Discussed regarding constipation.  #Again discussed regarding screening colonoscopy/follow-up with GI.  # CKD: Stage III creatinine 1.5.  Stable.  # DISPOSITION: # follow up in 6 months-MD/labs cbc/cmp/M- protein/ lkappa-lamda light chains-Dr.B

## 2018-11-09 NOTE — Patient Instructions (Signed)
#  Take iron pill 65 mg over-the-counter 1 every other day.

## 2018-11-12 LAB — MULTIPLE MYELOMA PANEL, SERUM
Albumin SerPl Elph-Mcnc: 4 g/dL (ref 2.9–4.4)
Albumin/Glob SerPl: 1.5 (ref 0.7–1.7)
Alpha 1: 0.2 g/dL (ref 0.0–0.4)
Alpha2 Glob SerPl Elph-Mcnc: 0.6 g/dL (ref 0.4–1.0)
B-Globulin SerPl Elph-Mcnc: 0.8 g/dL (ref 0.7–1.3)
Gamma Glob SerPl Elph-Mcnc: 1 g/dL (ref 0.4–1.8)
Globulin, Total: 2.7 g/dL (ref 2.2–3.9)
IgA: 151 mg/dL (ref 61–437)
IgG (Immunoglobin G), Serum: 812 mg/dL (ref 603–1613)
IgM (Immunoglobulin M), Srm: 381 mg/dL — ABNORMAL HIGH (ref 20–172)
M Protein SerPl Elph-Mcnc: 0.4 g/dL — ABNORMAL HIGH
Total Protein ELP: 6.7 g/dL (ref 6.0–8.5)

## 2018-11-12 LAB — KAPPA/LAMBDA LIGHT CHAINS
Kappa free light chain: 74.3 mg/L — ABNORMAL HIGH (ref 3.3–19.4)
Kappa, lambda light chain ratio: 4.56 — ABNORMAL HIGH (ref 0.26–1.65)
Lambda free light chains: 16.3 mg/L (ref 5.7–26.3)

## 2018-11-13 ENCOUNTER — Telehealth: Payer: Self-pay | Admitting: *Deleted

## 2018-11-13 NOTE — Telephone Encounter (Signed)
Spoke with patient regarding his test results

## 2018-11-13 NOTE — Telephone Encounter (Signed)
-----   Message from Cammie Sickle, MD sent at 11/13/2018  9:01 AM EDT ----- Nira Conn please inform patient that his M protein [marker of his lymphoma]-is stable; no concerns for recurrence of lymphoma noted.  Follow-up as planned no new recommendations.  Thank you

## 2019-02-10 IMAGING — CR DG CHEST 2V
1 series · 2 of 2 positions shown · non-contrast
Comparison: Chest CT April 17, 2015

CLINICAL DATA: Cough for 2 weeks.  History of lymphoma

EXAM:
CHEST  2 VIEW

[Series 1: dg chest 2 view · 0.14mm/px · 2 of 2 slices shown]
[im 1/2]
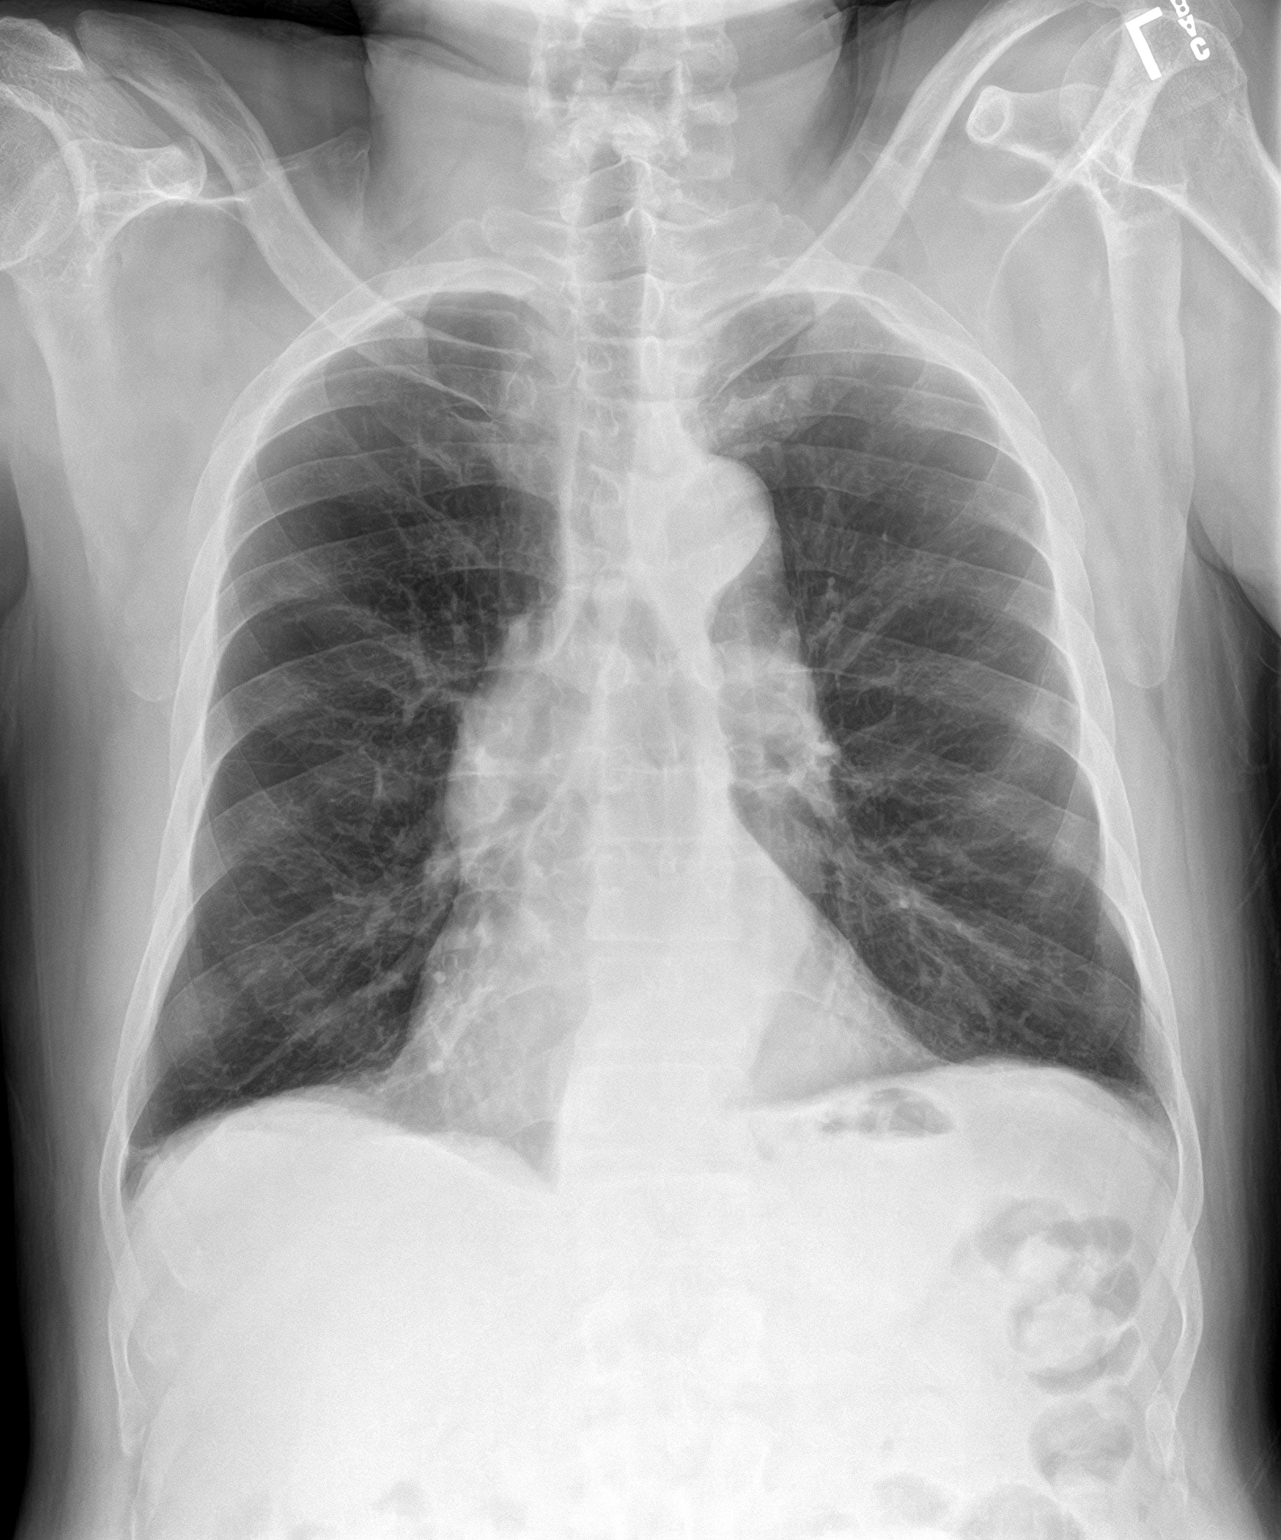
[im 2/2]
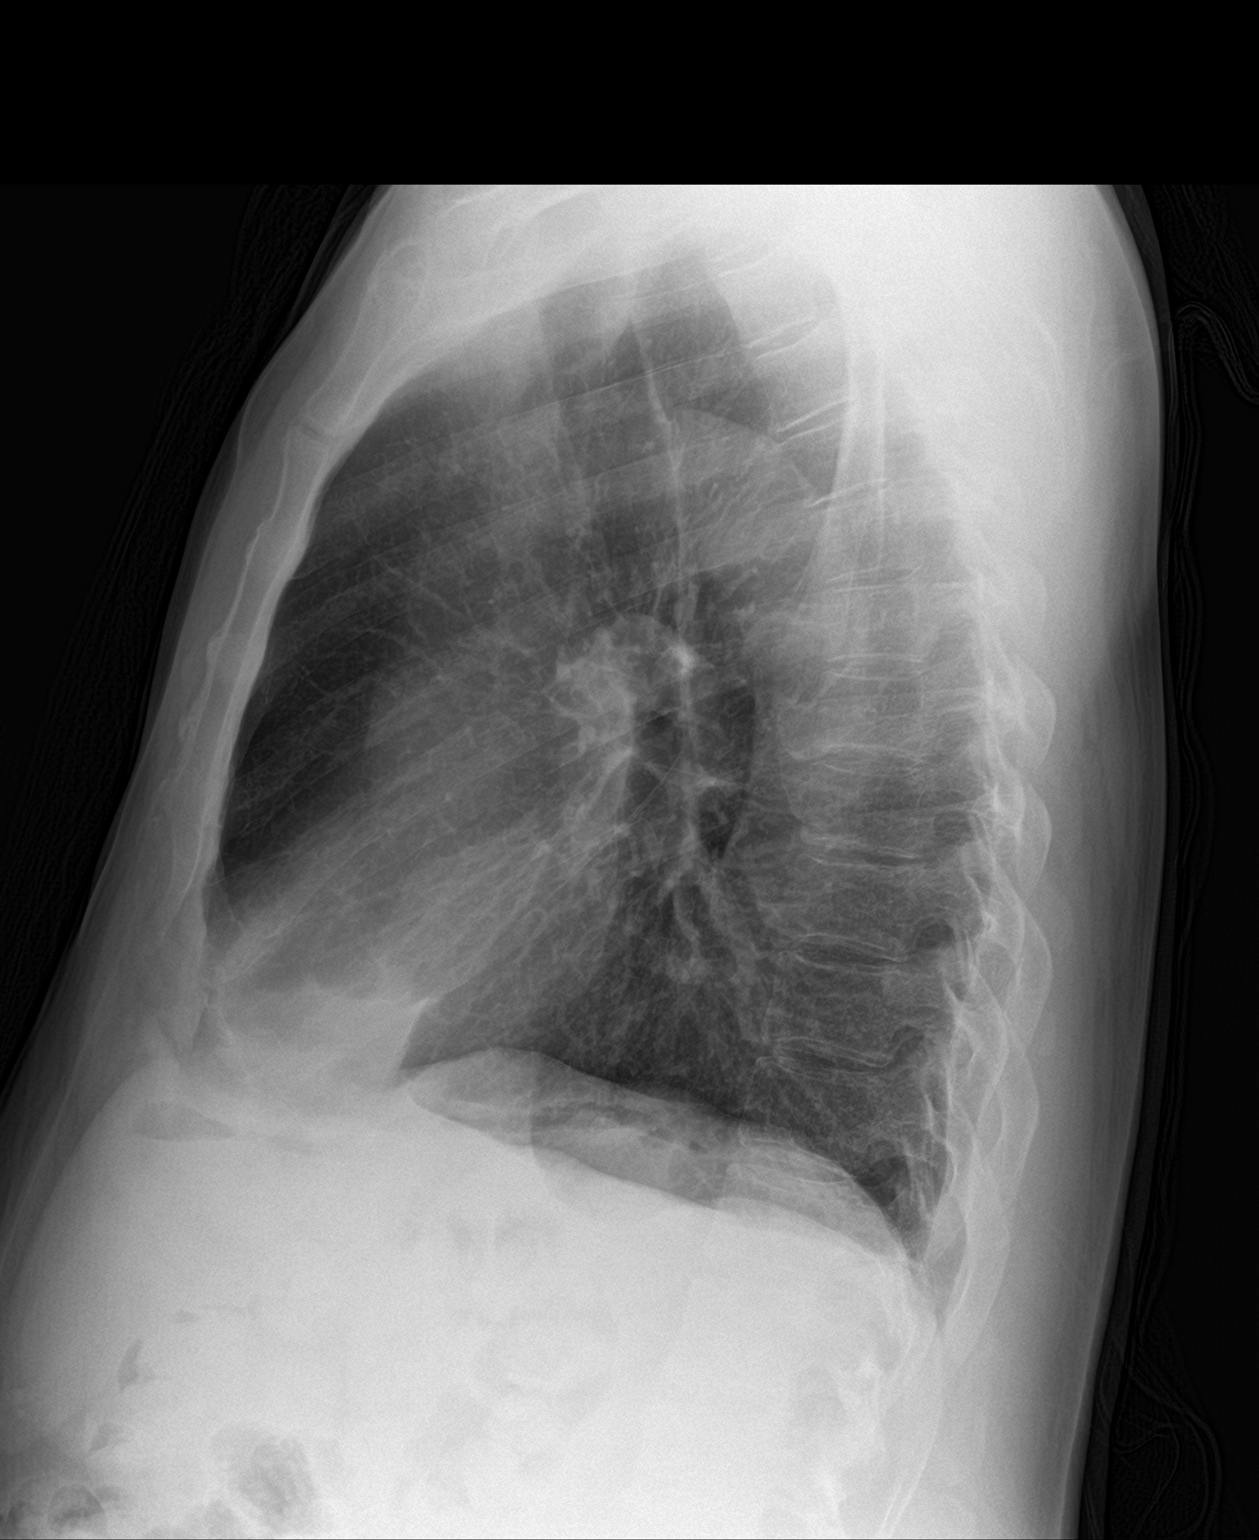

[2 of 2 positions shown; findings below may reference images not displayed]

FINDINGS: There is no edema or consolidation. There is slight scarring in the
right upper lobe. The heart size and pulmonary vascularity are
normal. No adenopathy evident. No bone lesions appreciable.
IMPRESSION: Slight scarring right upper lobe. No edema or consolidation. No
evident adenopathy.

## 2019-05-10 ENCOUNTER — Inpatient Hospital Stay (HOSPITAL_BASED_OUTPATIENT_CLINIC_OR_DEPARTMENT_OTHER): Payer: Managed Care, Other (non HMO) | Admitting: Internal Medicine

## 2019-05-10 ENCOUNTER — Other Ambulatory Visit: Payer: Self-pay

## 2019-05-10 ENCOUNTER — Encounter: Payer: Self-pay | Admitting: Internal Medicine

## 2019-05-10 ENCOUNTER — Inpatient Hospital Stay: Payer: Managed Care, Other (non HMO) | Attending: Internal Medicine

## 2019-05-10 DIAGNOSIS — C8300 Small cell B-cell lymphoma, unspecified site: Secondary | ICD-10-CM | POA: Diagnosis not present

## 2019-05-10 DIAGNOSIS — F17210 Nicotine dependence, cigarettes, uncomplicated: Secondary | ICD-10-CM | POA: Diagnosis not present

## 2019-05-10 DIAGNOSIS — Z8572 Personal history of non-Hodgkin lymphomas: Secondary | ICD-10-CM | POA: Insufficient documentation

## 2019-05-10 DIAGNOSIS — N183 Chronic kidney disease, stage 3 unspecified: Secondary | ICD-10-CM | POA: Insufficient documentation

## 2019-05-10 DIAGNOSIS — K219 Gastro-esophageal reflux disease without esophagitis: Secondary | ICD-10-CM | POA: Insufficient documentation

## 2019-05-10 DIAGNOSIS — M1990 Unspecified osteoarthritis, unspecified site: Secondary | ICD-10-CM | POA: Insufficient documentation

## 2019-05-10 DIAGNOSIS — Z8042 Family history of malignant neoplasm of prostate: Secondary | ICD-10-CM | POA: Insufficient documentation

## 2019-05-10 DIAGNOSIS — D649 Anemia, unspecified: Secondary | ICD-10-CM | POA: Diagnosis not present

## 2019-05-10 DIAGNOSIS — Z800 Family history of malignant neoplasm of digestive organs: Secondary | ICD-10-CM | POA: Insufficient documentation

## 2019-05-10 LAB — CBC WITH DIFFERENTIAL/PLATELET
Abs Immature Granulocytes: 0.01 10*3/uL (ref 0.00–0.07)
Basophils Absolute: 0.1 10*3/uL (ref 0.0–0.1)
Basophils Relative: 1 %
Eosinophils Absolute: 0.1 10*3/uL (ref 0.0–0.5)
Eosinophils Relative: 1 %
HCT: 39 % (ref 39.0–52.0)
Hemoglobin: 12.4 g/dL — ABNORMAL LOW (ref 13.0–17.0)
Immature Granulocytes: 0 %
Lymphocytes Relative: 23 %
Lymphs Abs: 1.9 10*3/uL (ref 0.7–4.0)
MCH: 28.7 pg (ref 26.0–34.0)
MCHC: 31.8 g/dL (ref 30.0–36.0)
MCV: 90.3 fL (ref 80.0–100.0)
Monocytes Absolute: 0.5 10*3/uL (ref 0.1–1.0)
Monocytes Relative: 6 %
Neutro Abs: 5.6 10*3/uL (ref 1.7–7.7)
Neutrophils Relative %: 69 %
Platelets: 251 10*3/uL (ref 150–400)
RBC: 4.32 MIL/uL (ref 4.22–5.81)
RDW: 15 % (ref 11.5–15.5)
WBC: 8.1 10*3/uL (ref 4.0–10.5)
nRBC: 0 % (ref 0.0–0.2)

## 2019-05-10 LAB — COMPREHENSIVE METABOLIC PANEL
ALT: 18 U/L (ref 0–44)
AST: 21 U/L (ref 15–41)
Albumin: 4 g/dL (ref 3.5–5.0)
Alkaline Phosphatase: 49 U/L (ref 38–126)
Anion gap: 8 (ref 5–15)
BUN: 16 mg/dL (ref 8–23)
CO2: 25 mmol/L (ref 22–32)
Calcium: 9 mg/dL (ref 8.9–10.3)
Chloride: 105 mmol/L (ref 98–111)
Creatinine, Ser: 1.52 mg/dL — ABNORMAL HIGH (ref 0.61–1.24)
GFR calc Af Amer: 55 mL/min — ABNORMAL LOW (ref >60)
GFR calc non Af Amer: 48 mL/min — ABNORMAL LOW (ref >60)
Glucose, Bld: 139 mg/dL — ABNORMAL HIGH (ref 70–99)
Potassium: 3.1 mmol/L — ABNORMAL LOW (ref 3.5–5.1)
Sodium: 138 mmol/L (ref 135–145)
Total Bilirubin: 0.6 mg/dL (ref 0.3–1.2)
Total Protein: 7 g/dL (ref 6.5–8.1)

## 2019-05-10 NOTE — Assessment & Plan Note (Addendum)
#    Malignant lymphoplasmacytic lymphoma-stage IV low-grade-completed maintenance Rituxan 11/16.  CT 1/17 showed no evidence of disease.   #  Clinically stable.  No evidence of progression.  Continue surveillance without any additional imaging.  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 Recommend PO iron every other day.    # Again discussed regarding screening colonoscopy/follow-up with GI.  # CKD: Stage III creatinine 1.5.  Stable.  # DISPOSITION: # follow up in 6 months-MD/labs cbc/cmp/M- protein/ lkappa-lamda light chains-Dr.B

## 2019-05-10 NOTE — Progress Notes (Signed)
[place] OFFICE PROGRESS NOTE  Patient Care Team: Center, [facility] as PCP - General   SUMMARY OF ONCOLOGIC HISTORY:  . 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 CT [non-contrast]- January 2017- NED  # CKD [stage III creat 1.8]  DIAGNOSIS: lymphoplasmacytic lymphoma/[place]stroms  STAGE:   IV      ;GOALS: control  CURRENT/MOST RECENT THERAPY: surveillaince   Malignant lymphoma, lymphoplasmacytic (HCC)    INTERVAL HISTORY:  A very pleasant 64 year old male patient with above history of stage IV lymphoplasmacytic lymphoma/marginal zone lymphoma currently status post maintenance rituximab [last treatment November 2016].  Patient denies any blood in stools or black-colored stools.  Denies any nausea vomiting.  No weight loss.  No tingling or numbness.  He continues to work.   Review of Systems  Constitutional: Negative for chills, diaphoresis, fever, malaise/fatigue and weight loss.  HENT: Negative for nosebleeds and sore throat.   Eyes: Negative for double vision.  Respiratory: Negative for cough, hemoptysis, sputum production, shortness of breath and wheezing.   Cardiovascular: Negative for chest pain, palpitations, orthopnea and leg swelling.  Gastrointestinal: Negative for abdominal pain, blood in stool, constipation, diarrhea, heartburn, melena, nausea and vomiting.  Genitourinary: Negative for dysuria, frequency and urgency.  Musculoskeletal: Negative for back pain and joint pain.  Skin: Negative.  Negative for itching and rash.  Neurological: Negative for dizziness, tingling, focal weakness, weakness and headaches.  Endo/Heme/Allergies: Does not  bruise/bleed easily.  Psychiatric/Behavioral: Negative for depression. The patient is not nervous/anxious and does not have insomnia.      PAST MEDICAL HISTORY :  Past Medical History:  Diagnosis Date  . Anemia   . Arthritis   . Chronic kidney disease   . GERD (gastroesophageal reflux disease)   . Non Hodgkin's lymphoma ([place])     PAST SURGICAL HISTORY :   Past Surgical History:  Procedure Laterality Date  . BONE MARROW BIOPSY    . Left eye surgery  2012  . Left knee surgery  1981   ACL repair    FAMILY HISTORY :   Family History  Problem Relation Age of Onset  . Pancreatic cancer Mother 9  . Prostate cancer Brother 33  . Prostate cancer Father 67    SOCIAL HISTORY:   Social History   Tobacco Use  . Smoking status: Current Every Day Smoker    Packs/day: 0.25    Years: 10.00    Pack years: 2.50    Types: Cigarettes  . Smokeless tobacco: Never Used  . Tobacco comment: "I only smoke in the bar on occasion"  Substance Use Topics  . Alcohol use: Yes    Alcohol/week: 0.0 standard drinks    Comment: consumes alcohol on the weekend.  . Drug use: No    ALLERGIES:  is allergic to no known allergies.  MEDICATIONS:  Current Outpatient Medications  Medication Sig Dispense Refill  . pantoprazole (PROTONIX) 40 MG tablet Take 40 mg by mouth daily.   0   No current facility-administered medications for this visit.    PHYSICAL EXAMINATION: ECOG PERFORMANCE STATUS: 0 - Asymptomatic  BP 136/88 (BP Location: Right Arm, Patient Position: Sitting, Cuff Size: Normal)   Pulse 72   Temp 98.7 F (37.1 C) (Tympanic)  Wt 166 lb (75.3 kg)   BMI 26.79 kg/m   Filed Weights   05/10/19 1353  Weight: 166 lb (75.3 kg)    Physical Exam  Constitutional: He is oriented to person, place, and time and well-developed, well-nourished, and in no distress.  He is alone.   HENT:  Head: Normocephalic and atraumatic.  Mouth/Throat: Oropharynx is clear and moist. No oropharyngeal  exudate.  Eyes: Pupils are equal, round, and reactive to light.  Cardiovascular: Normal rate and regular rhythm.  Pulmonary/Chest: No respiratory distress. He has no wheezes.  Abdominal: Soft. Bowel sounds are normal. He exhibits no distension and no mass. There is no abdominal tenderness. There is no rebound and no guarding.  Musculoskeletal:        General: No tenderness or edema. Normal range of motion.     Cervical back: Normal range of motion and neck supple.  Neurological: He is alert and oriented to person, place, and time.  Skin: Skin is warm.  Psychiatric: Affect normal.    LABORATORY DATA:  I have reviewed the data as listed    Component Value Date/Time   NA 138 05/10/2019 1325   NA 139 11/08/2013 0824   K 3.1 (L) 05/10/2019 1325   K 4.3 11/08/2013 0824   CL 105 05/10/2019 1325   CL 104 11/08/2013 0824   CO2 25 05/10/2019 1325   CO2 27 11/08/2013 0824   GLUCOSE 139 (H) 05/10/2019 1325   GLUCOSE 92 11/08/2013 0824   BUN 16 05/10/2019 1325   BUN 18 11/08/2013 0824   CREATININE 1.52 (H) 05/10/2019 1325   CREATININE 1.85 (H) 04/25/2014 0840   CALCIUM 9.0 05/10/2019 1325   CALCIUM 8.8 11/08/2013 0824   PROT 7.0 05/10/2019 1325   PROT 6.6 02/28/2014 0902   ALBUMIN 4.0 05/10/2019 1325   ALBUMIN 3.5 02/28/2014 0902   AST 21 05/10/2019 1325   AST 14 (L) 02/28/2014 0902   ALT 18 05/10/2019 1325   ALT 19 02/28/2014 0902   ALKPHOS 49 05/10/2019 1325   ALKPHOS 53 02/28/2014 0902   BILITOT 0.6 05/10/2019 1325   BILITOT 0.3 02/28/2014 0902   GFRNONAA 48 (L) 05/10/2019 1325   GFRNONAA 40 (L) 04/25/2014 0840   GFRNONAA 46 (L) 11/08/2013 0824   GFRAA 55 (L) 05/10/2019 1325   GFRAA 48 (L) 04/25/2014 0840   GFRAA 53 (L) 11/08/2013 0824    No results found for: SPEP, UPEP  Lab Results  Component Value Date   WBC 8.1 05/10/2019   NEUTROABS 5.6 05/10/2019   HGB 12.4 (L) 05/10/2019   HCT 39.0 05/10/2019   MCV 90.3 05/10/2019   PLT 251 05/10/2019      Chemistry       Component Value Date/Time   NA 138 05/10/2019 1325   NA 139 11/08/2013 0824   K 3.1 (L) 05/10/2019 1325   K 4.3 11/08/2013 0824   CL 105 05/10/2019 1325   CL 104 11/08/2013 0824   CO2 25 05/10/2019 1325   CO2 27 11/08/2013 0824   BUN 16 05/10/2019 1325   BUN 18 11/08/2013 0824   CREATININE 1.52 (H) 05/10/2019 1325   CREATININE 1.85 (H) 04/25/2014 0840      Component Value Date/Time   CALCIUM 9.0 05/10/2019 1325   CALCIUM 8.8 11/08/2013 0824   ALKPHOS 49 05/10/2019 1325   ALKPHOS 53 02/28/2014 0902   AST 21 05/10/2019 1325   AST 14 (L) 02/28/2014 0902   ALT 18 05/10/2019 1325   ALT 19 02/28/2014  0902   BILITOT 0.6 05/10/2019 1325   BILITOT 0.3 02/28/2014 0902       ASSESSMENT & PLAN:   Malignant lymphoma, lymphoplasmacytic ([place]) #  Malignant lymphoplasmacytic lymphoma-stage IV low-grade-completed maintenance Rituxan 11/16.  CT 1/17 showed no evidence of disease.   #  Clinically stable.  No evidence of progression.  Continue surveillance without any additional imaging.  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 Recommend PO iron every other day.    # Again discussed regarding screening colonoscopy/follow-up with GI.  # CKD: Stage III creatinine 1.5.  Stable.  # DISPOSITION: # follow up in 6 months-MD/labs cbc/cmp/M- protein/ lkappa-lamda light chains-Dr.B     Cammie Sickle, MD 05/14/2019 4:39 PM

## 2019-05-10 NOTE — Patient Instructions (Signed)
#   Vitron C [over the counter] once  Day.

## 2019-05-13 LAB — KAPPA/LAMBDA LIGHT CHAINS
Kappa free light chain: 75.1 mg/L — ABNORMAL HIGH (ref 3.3–19.4)
Kappa, lambda light chain ratio: 4.24 — ABNORMAL HIGH (ref 0.26–1.65)
Lambda free light chains: 17.7 mg/L (ref 5.7–26.3)

## 2019-05-14 LAB — MULTIPLE MYELOMA PANEL, SERUM
Albumin SerPl Elph-Mcnc: 3.8 g/dL (ref 2.9–4.4)
Albumin/Glob SerPl: 1.6 (ref 0.7–1.7)
Alpha 1: 0.2 g/dL (ref 0.0–0.4)
Alpha2 Glob SerPl Elph-Mcnc: 0.6 g/dL (ref 0.4–1.0)
B-Globulin SerPl Elph-Mcnc: 0.8 g/dL (ref 0.7–1.3)
Gamma Glob SerPl Elph-Mcnc: 1 g/dL (ref 0.4–1.8)
Globulin, Total: 2.5 g/dL (ref 2.2–3.9)
IgA: 142 mg/dL (ref 61–437)
IgG (Immunoglobin G), Serum: 762 mg/dL (ref 603–1613)
IgM (Immunoglobulin M), Srm: 385 mg/dL — ABNORMAL HIGH (ref 20–172)
M Protein SerPl Elph-Mcnc: 0.4 g/dL — ABNORMAL HIGH
Total Protein ELP: 6.3 g/dL (ref 6.0–8.5)

## 2019-11-08 ENCOUNTER — Other Ambulatory Visit: Payer: Self-pay

## 2019-11-08 ENCOUNTER — Encounter: Payer: Self-pay | Admitting: Internal Medicine

## 2019-11-08 ENCOUNTER — Inpatient Hospital Stay: Payer: Managed Care, Other (non HMO) | Attending: Internal Medicine

## 2019-11-08 ENCOUNTER — Inpatient Hospital Stay (HOSPITAL_BASED_OUTPATIENT_CLINIC_OR_DEPARTMENT_OTHER): Payer: Managed Care, Other (non HMO) | Admitting: Internal Medicine

## 2019-11-08 VITALS — BP 122/85 | HR 65 | Temp 97.2°F | Resp 16 | Ht 66.0 in | Wt 158.0 lb

## 2019-11-08 DIAGNOSIS — F17210 Nicotine dependence, cigarettes, uncomplicated: Secondary | ICD-10-CM | POA: Insufficient documentation

## 2019-11-08 DIAGNOSIS — D649 Anemia, unspecified: Secondary | ICD-10-CM | POA: Diagnosis not present

## 2019-11-08 DIAGNOSIS — Z79899 Other long term (current) drug therapy: Secondary | ICD-10-CM | POA: Diagnosis not present

## 2019-11-08 DIAGNOSIS — Z1211 Encounter for screening for malignant neoplasm of colon: Secondary | ICD-10-CM

## 2019-11-08 DIAGNOSIS — N183 Chronic kidney disease, stage 3 unspecified: Secondary | ICD-10-CM | POA: Insufficient documentation

## 2019-11-08 DIAGNOSIS — Z8572 Personal history of non-Hodgkin lymphomas: Secondary | ICD-10-CM | POA: Insufficient documentation

## 2019-11-08 DIAGNOSIS — C8300 Small cell B-cell lymphoma, unspecified site: Secondary | ICD-10-CM

## 2019-11-08 LAB — CBC WITH DIFFERENTIAL/PLATELET
Abs Immature Granulocytes: 0.01 10*3/uL (ref 0.00–0.07)
Basophils Absolute: 0.1 10*3/uL (ref 0.0–0.1)
Basophils Relative: 1 %
Eosinophils Absolute: 0.1 10*3/uL (ref 0.0–0.5)
Eosinophils Relative: 2 %
HCT: 37.7 % — ABNORMAL LOW (ref 39.0–52.0)
Hemoglobin: 12.5 g/dL — ABNORMAL LOW (ref 13.0–17.0)
Immature Granulocytes: 0 %
Lymphocytes Relative: 28 %
Lymphs Abs: 1.6 10*3/uL (ref 0.7–4.0)
MCH: 29.2 pg (ref 26.0–34.0)
MCHC: 33.2 g/dL (ref 30.0–36.0)
MCV: 88.1 fL (ref 80.0–100.0)
Monocytes Absolute: 0.5 10*3/uL (ref 0.1–1.0)
Monocytes Relative: 9 %
Neutro Abs: 3.4 10*3/uL (ref 1.7–7.7)
Neutrophils Relative %: 60 %
Platelets: 260 10*3/uL (ref 150–400)
RBC: 4.28 MIL/uL (ref 4.22–5.81)
RDW: 14.9 % (ref 11.5–15.5)
WBC: 5.7 10*3/uL (ref 4.0–10.5)
nRBC: 0 % (ref 0.0–0.2)

## 2019-11-08 LAB — COMPREHENSIVE METABOLIC PANEL
ALT: 13 U/L (ref 0–44)
AST: 18 U/L (ref 15–41)
Albumin: 3.9 g/dL (ref 3.5–5.0)
Alkaline Phosphatase: 41 U/L (ref 38–126)
Anion gap: 8 (ref 5–15)
BUN: 17 mg/dL (ref 8–23)
CO2: 23 mmol/L (ref 22–32)
Calcium: 8.9 mg/dL (ref 8.9–10.3)
Chloride: 108 mmol/L (ref 98–111)
Creatinine, Ser: 1.4 mg/dL — ABNORMAL HIGH (ref 0.61–1.24)
GFR calc Af Amer: >60 mL/min (ref >60)
GFR calc non Af Amer: 52 mL/min — ABNORMAL LOW (ref >60)
Glucose, Bld: 100 mg/dL — ABNORMAL HIGH (ref 70–99)
Potassium: 3.7 mmol/L (ref 3.5–5.1)
Sodium: 139 mmol/L (ref 135–145)
Total Bilirubin: 0.6 mg/dL (ref 0.3–1.2)
Total Protein: 6.8 g/dL (ref 6.5–8.1)

## 2019-11-08 NOTE — Progress Notes (Signed)
[place] OFFICE PROGRESS NOTE  Patient Care Team: Center, [facility] as PCP - General   SUMMARY OF ONCOLOGIC HISTORY:  . 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 CT [non-contrast]- January 2017- NED  # CKD [stage III creat 1.8]  DIAGNOSIS: lymphoplasmacytic lymphoma/Waldenstroms  STAGE:   IV      ;GOALS: control  CURRENT/MOST RECENT THERAPY: surveillaince   Malignant lymphoma, lymphoplasmacytic (HCC)    INTERVAL HISTORY:  A very pleasant 64 year old male patient with above history of stage IV lymphoplasmacytic lymphoma/marginal zone lymphoma currently status post maintenance rituximab [last treatment November 2016].  Patient denies any abdominal pain nausea vomiting or weight loss.  No tingling numbness.  He continues to work full-time.   Review of Systems  Constitutional: Negative for chills, diaphoresis, fever, malaise/fatigue and weight loss.  HENT: Negative for nosebleeds and sore throat.   Eyes: Negative for double vision.  Respiratory: Negative for cough, hemoptysis, sputum production, shortness of breath and wheezing.   Cardiovascular: Negative for chest pain, palpitations, orthopnea and leg swelling.  Gastrointestinal: Negative for abdominal pain, blood in stool, constipation, diarrhea, heartburn, melena, nausea and vomiting.  Genitourinary: Negative for dysuria, frequency and urgency.  Musculoskeletal: Negative for back pain and joint pain.  Skin: Negative.  Negative for itching and rash.  Neurological: Negative for dizziness, tingling, focal weakness, weakness and headaches.  Endo/Heme/Allergies: Does not bruise/bleed easily.   Psychiatric/Behavioral: Negative for depression. The patient is not nervous/anxious and does not have insomnia.      PAST MEDICAL HISTORY :  Past Medical History:  Diagnosis Date  . Anemia   . Arthritis   . Chronic kidney disease   . GERD (gastroesophageal reflux disease)   . Non Hodgkin's lymphoma ([place])     PAST SURGICAL HISTORY :   Past Surgical History:  Procedure Laterality Date  . BONE MARROW BIOPSY    . Left eye surgery  2012  . Left knee surgery  1981   ACL repair    FAMILY HISTORY :   Family History  Problem Relation Age of Onset  . Pancreatic cancer Mother 80  . Prostate cancer Brother 22  . Prostate cancer Father 1    SOCIAL HISTORY:   Social History   Tobacco Use  . Smoking status: Current Every Day Smoker    Packs/day: 0.25    Years: 10.00    Pack years: 2.50    Types: Cigarettes  . Smokeless tobacco: Never Used  . Tobacco comment: "I only smoke in the bar on occasion"  Substance Use Topics  . Alcohol use: Yes    Alcohol/week: 0.0 standard drinks    Comment: consumes alcohol on the weekend.  . Drug use: No    ALLERGIES:  is allergic to no known allergies.  MEDICATIONS:  Current Outpatient Medications  Medication Sig Dispense Refill  . pantoprazole (PROTONIX) 40 MG tablet Take 40 mg by mouth daily.   0   No current facility-administered medications for this visit.    PHYSICAL EXAMINATION: ECOG PERFORMANCE STATUS: 0 - Asymptomatic  BP 122/85 (BP Location: Left Arm, Patient Position: Sitting, Cuff Size: Normal)   Pulse 65   Temp (!) 97.2 F (36.2 C) (Tympanic)   Resp 16   Ht '5\' 6"'  (  1.676 m)   Wt 158 lb (71.7 kg)   SpO2 99%   BMI 25.50 kg/m   Filed Weights   11/08/19 1016  Weight: 158 lb (71.7 kg)    Physical Exam Constitutional:      Comments: He is alone.   HENT:     Head: Normocephalic and atraumatic.     Mouth/Throat:     Pharynx: No oropharyngeal exudate.  Eyes:     Pupils: Pupils are equal, round, and reactive to  light.  Cardiovascular:     Rate and Rhythm: Normal rate and regular rhythm.  Pulmonary:     Effort: No respiratory distress.     Breath sounds: No wheezing.  Abdominal:     General: Bowel sounds are normal. There is no distension.     Palpations: Abdomen is soft. There is no mass.     Tenderness: There is no abdominal tenderness. There is no guarding or rebound.  Musculoskeletal:        General: No tenderness. Normal range of motion.     Cervical back: Normal range of motion and neck supple.  Skin:    General: Skin is warm.  Neurological:     Mental Status: He is alert and oriented to person, place, and time.  Psychiatric:        Mood and Affect: Affect normal.     LABORATORY DATA:  I have reviewed the data as listed    Component Value Date/Time   NA 139 11/08/2019 0915   NA 139 11/08/2013 0824   K 3.7 11/08/2019 0915   K 4.3 11/08/2013 0824   CL 108 11/08/2019 0915   CL 104 11/08/2013 0824   CO2 23 11/08/2019 0915   CO2 27 11/08/2013 0824   GLUCOSE 100 (H) 11/08/2019 0915   GLUCOSE 92 11/08/2013 0824   BUN 17 11/08/2019 0915   BUN 18 11/08/2013 0824   CREATININE 1.40 (H) 11/08/2019 0915   CREATININE 1.85 (H) 04/25/2014 0840   CALCIUM 8.9 11/08/2019 0915   CALCIUM 8.8 11/08/2013 0824   PROT 6.8 11/08/2019 0915   PROT 6.6 02/28/2014 0902   ALBUMIN 3.9 11/08/2019 0915   ALBUMIN 3.5 02/28/2014 0902   AST 18 11/08/2019 0915   AST 14 (L) 02/28/2014 0902   ALT 13 11/08/2019 0915   ALT 19 02/28/2014 0902   ALKPHOS 41 11/08/2019 0915   ALKPHOS 53 02/28/2014 0902   BILITOT 0.6 11/08/2019 0915   BILITOT 0.3 02/28/2014 0902   GFRNONAA 52 (L) 11/08/2019 0915   GFRNONAA 40 (L) 04/25/2014 0840   GFRNONAA 46 (L) 11/08/2013 0824   GFRAA >60 11/08/2019 0915   GFRAA 48 (L) 04/25/2014 0840   GFRAA 53 (L) 11/08/2013 0824    No results found for: SPEP, UPEP  Lab Results  Component Value Date   WBC 5.7 11/08/2019   NEUTROABS 3.4 11/08/2019   HGB 12.5 (L) 11/08/2019    HCT 37.7 (L) 11/08/2019   MCV 88.1 11/08/2019   PLT 260 11/08/2019      Chemistry      Component Value Date/Time   NA 139 11/08/2019 0915   NA 139 11/08/2013 0824   K 3.7 11/08/2019 0915   K 4.3 11/08/2013 0824   CL 108 11/08/2019 0915   CL 104 11/08/2013 0824   CO2 23 11/08/2019 0915   CO2 27 11/08/2013 0824   BUN 17 11/08/2019 0915   BUN 18 11/08/2013 0824   CREATININE 1.40 (H) 11/08/2019 0915   CREATININE 1.85 (H)  04/25/2014 0840      Component Value Date/Time   CALCIUM 8.9 11/08/2019 0915   CALCIUM 8.8 11/08/2013 0824   ALKPHOS 41 11/08/2019 0915   ALKPHOS 53 02/28/2014 0902   AST 18 11/08/2019 0915   AST 14 (L) 02/28/2014 0902   ALT 13 11/08/2019 0915   ALT 19 02/28/2014 0902   BILITOT 0.6 11/08/2019 0915   BILITOT 0.3 02/28/2014 0902       ASSESSMENT & PLAN:   Malignant lymphoma, lymphoplasmacytic ([place]) #  Malignant lymphoplasmacytic lymphoma-stage IV low-grade-completed maintenance Rituxan 11/16.  CT 1/17 showed no evidence of disease.   #  STABLE:  No evidence of progression.  Continue surveillance without any additional imaging.  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  # Mild anemia-hemoglobin 12.5;   Likely CKD. Recommend compliance with PO iron every other day.    # Again discussed regarding screening colonoscopy/follow-up with GI-refer ot Dr.Anna  # CKD: Stage III creatinine 1.5.STABLE.   # DISPOSITION: # refer to Dr.Anna re: screening colonoscopy # follow up in 6 months-MD/labs cbc/cmp/M- protein/ lkappa-lamda light chains-Dr.B     Cammie Sickle, MD 11/10/2019 10:22 PM

## 2019-11-08 NOTE — Assessment & Plan Note (Addendum)
#    Malignant lymphoplasmacytic lymphoma-stage IV low-grade-completed maintenance Rituxan 11/16.  CT 1/17 showed no evidence of disease.   #  STABLE:  No evidence of progression.  Continue surveillance without any additional imaging.  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  # Mild anemia-hemoglobin 12.5;   Likely CKD. Recommend compliance with PO iron every other day.    # Again discussed regarding screening colonoscopy/follow-up with GI-refer ot Dr.Anna  # CKD: Stage III creatinine 1.5.STABLE.   # DISPOSITION: # refer to Dr.Anna re: screening colonoscopy # follow up in 6 months-MD/labs cbc/cmp/M- protein/ lkappa-lamda light chains-Dr.B

## 2019-11-10 LAB — KAPPA/LAMBDA LIGHT CHAINS
Kappa free light chain: 108.4 mg/L — ABNORMAL HIGH (ref 3.3–19.4)
Kappa, lambda light chain ratio: 5.96 — ABNORMAL HIGH (ref 0.26–1.65)
Lambda free light chains: 18.2 mg/L (ref 5.7–26.3)

## 2019-11-12 LAB — MULTIPLE MYELOMA PANEL, SERUM
Albumin SerPl Elph-Mcnc: 3.6 g/dL (ref 2.9–4.4)
Albumin/Glob SerPl: 1.4 (ref 0.7–1.7)
Alpha 1: 0.2 g/dL (ref 0.0–0.4)
Alpha2 Glob SerPl Elph-Mcnc: 0.6 g/dL (ref 0.4–1.0)
B-Globulin SerPl Elph-Mcnc: 0.9 g/dL (ref 0.7–1.3)
Gamma Glob SerPl Elph-Mcnc: 1.1 g/dL (ref 0.4–1.8)
Globulin, Total: 2.7 g/dL (ref 2.2–3.9)
IgA: 165 mg/dL (ref 61–437)
IgG (Immunoglobin G), Serum: 783 mg/dL (ref 603–1613)
IgM (Immunoglobulin M), Srm: 403 mg/dL — ABNORMAL HIGH (ref 20–172)
M Protein SerPl Elph-Mcnc: 0.4 g/dL — ABNORMAL HIGH
Total Protein ELP: 6.3 g/dL (ref 6.0–8.5)

## 2019-11-13 ENCOUNTER — Telehealth: Payer: Self-pay | Admitting: Internal Medicine

## 2019-11-13 NOTE — Telephone Encounter (Signed)
Spoke to patient regarding results of the myeloma protein kappa lambda light chain-slightly abnormal/worse however, overall clinically stable.  Recommend continued surveillance in 6 months.

## 2019-11-18 ENCOUNTER — Telehealth (INDEPENDENT_AMBULATORY_CARE_PROVIDER_SITE_OTHER): Payer: Self-pay | Admitting: Gastroenterology

## 2019-11-18 ENCOUNTER — Telehealth: Payer: Self-pay

## 2019-11-18 ENCOUNTER — Other Ambulatory Visit: Payer: Self-pay

## 2019-11-18 DIAGNOSIS — Z1211 Encounter for screening for malignant neoplasm of colon: Secondary | ICD-10-CM

## 2019-11-18 MED ORDER — NA SULFATE-K SULFATE-MG SULF 17.5-3.13-1.6 GM/177ML PO SOLN: 1.0000 | Freq: Once | ORAL | 0 refills | Status: AC

## 2019-11-18 NOTE — Progress Notes (Signed)
Gastroenterology Pre-Procedure Review  Request Date: September 9th Requesting Physician: Dr. Vicente Males  PATIENT REVIEW QUESTIONS: The patient responded to the following health history questions as indicated:    1. Are you having any GI issues? no 2. Do you have a personal history of Polyps? no 3. Do you have a family history of Colon Cancer or Polyps? no 4. Diabetes Mellitus? no 5. Joint replacements in the past 12 months?no 6. Major health problems in the past 3 months?no 7. Any artificial heart valves, MVP, or defibrillator?no    MEDICATIONS & ALLERGIES:    Patient reports the following regarding taking any anticoagulation/antiplatelet therapy:   Plavix, Coumadin, Eliquis, Xarelto, Lovenox, Pradaxa, Brilinta, or Effient? no Aspirin? no  Patient confirms/reports the following medications:  Current Outpatient Medications  Medication Sig Dispense Refill  . pantoprazole (PROTONIX) 40 MG tablet Take 40 mg by mouth daily.   0  . Na Sulfate-K Sulfate-Mg Sulf 17.5-3.13-1.6 GM/177ML SOLN Take 1 kit by mouth once for 1 dose. 354 mL 0   No current facility-administered medications for this visit.    Patient confirms/reports the following allergies:  Allergies  Allergen Reactions  . No Known Allergies     No orders of the defined types were placed in this encounter.   AUTHORIZATION INFORMATION Primary Insurance: 1D#: Group #:  Secondary Insurance: 1D#: Group #:  SCHEDULE INFORMATION: Date: Sept 9th Time: Location:[facility]

## 2019-11-18 NOTE — Telephone Encounter (Signed)
Patient missed his colonoscopy nurse triage call this morning.  I called him at 9:34am LVM for him to call the office back.    He has now just returned my call to schedule this visit.  Thanks,  [place], [place]

## 2019-12-17 ENCOUNTER — Other Ambulatory Visit
Admission: RE | Admit: 2019-12-17 | Discharge: 2019-12-17 | Disposition: A | Discharge status: Home / Self Care | Payer: Managed Care, Other (non HMO) | Source: Ambulatory Visit | Attending: Gastroenterology | Admitting: Gastroenterology

## 2019-12-19 ENCOUNTER — Encounter: Admission: RE | Payer: Self-pay | Source: Ambulatory Visit

## 2019-12-19 ENCOUNTER — Ambulatory Visit
Admission: RE | Admit: 2019-12-19 | Payer: Managed Care, Other (non HMO) | Source: Ambulatory Visit | Admitting: Gastroenterology

## 2019-12-19 SURGERY — COLONOSCOPY WITH PROPOFOL
Anesthesia: General

## 2020-05-11 ENCOUNTER — Inpatient Hospital Stay: Payer: Managed Care, Other (non HMO) | Attending: Internal Medicine

## 2020-05-11 ENCOUNTER — Encounter: Payer: Self-pay | Admitting: Internal Medicine

## 2020-05-11 ENCOUNTER — Inpatient Hospital Stay (HOSPITAL_BASED_OUTPATIENT_CLINIC_OR_DEPARTMENT_OTHER): Payer: Managed Care, Other (non HMO) | Admitting: Internal Medicine

## 2020-05-11 DIAGNOSIS — Z8572 Personal history of non-Hodgkin lymphomas: Secondary | ICD-10-CM | POA: Insufficient documentation

## 2020-05-11 DIAGNOSIS — C8300 Small cell B-cell lymphoma, unspecified site: Secondary | ICD-10-CM | POA: Diagnosis not present

## 2020-05-11 DIAGNOSIS — N183 Chronic kidney disease, stage 3 unspecified: Secondary | ICD-10-CM | POA: Diagnosis not present

## 2020-05-11 DIAGNOSIS — Z79899 Other long term (current) drug therapy: Secondary | ICD-10-CM | POA: Diagnosis not present

## 2020-05-11 DIAGNOSIS — F17210 Nicotine dependence, cigarettes, uncomplicated: Secondary | ICD-10-CM | POA: Diagnosis not present

## 2020-05-11 DIAGNOSIS — D649 Anemia, unspecified: Secondary | ICD-10-CM | POA: Diagnosis not present

## 2020-05-11 LAB — CBC WITH DIFFERENTIAL/PLATELET
Abs Immature Granulocytes: 0.01 10*3/uL (ref 0.00–0.07)
Basophils Absolute: 0.1 10*3/uL (ref 0.0–0.1)
Basophils Relative: 1 %
Eosinophils Absolute: 0.1 10*3/uL (ref 0.0–0.5)
Eosinophils Relative: 1 %
HCT: 39.2 % (ref 39.0–52.0)
Hemoglobin: 13.1 g/dL (ref 13.0–17.0)
Immature Granulocytes: 0 %
Lymphocytes Relative: 25 %
Lymphs Abs: 1.5 10*3/uL (ref 0.7–4.0)
MCH: 29.4 pg (ref 26.0–34.0)
MCHC: 33.4 g/dL (ref 30.0–36.0)
MCV: 87.9 fL (ref 80.0–100.0)
Monocytes Absolute: 0.5 10*3/uL (ref 0.1–1.0)
Monocytes Relative: 8 %
Neutro Abs: 4 10*3/uL (ref 1.7–7.7)
Neutrophils Relative %: 65 %
Platelets: 274 10*3/uL (ref 150–400)
RBC: 4.46 MIL/uL (ref 4.22–5.81)
RDW: 15.1 % (ref 11.5–15.5)
WBC: 6.1 10*3/uL (ref 4.0–10.5)
nRBC: 0 % (ref 0.0–0.2)

## 2020-05-11 LAB — COMPREHENSIVE METABOLIC PANEL
ALT: 14 U/L (ref 0–44)
AST: 19 U/L (ref 15–41)
Albumin: 3.9 g/dL (ref 3.5–5.0)
Alkaline Phosphatase: 45 U/L (ref 38–126)
Anion gap: 10 (ref 5–15)
BUN: 21 mg/dL (ref 8–23)
CO2: 22 mmol/L (ref 22–32)
Calcium: 9.1 mg/dL (ref 8.9–10.3)
Chloride: 103 mmol/L (ref 98–111)
Creatinine, Ser: 1.47 mg/dL — ABNORMAL HIGH (ref 0.61–1.24)
GFR, Estimated: 53 mL/min — ABNORMAL LOW (ref >60)
Glucose, Bld: 105 mg/dL — ABNORMAL HIGH (ref 70–99)
Potassium: 3.9 mmol/L (ref 3.5–5.1)
Sodium: 135 mmol/L (ref 135–145)
Total Bilirubin: 0.4 mg/dL (ref 0.3–1.2)
Total Protein: 7.1 g/dL (ref 6.5–8.1)

## 2020-05-11 NOTE — Assessment & Plan Note (Addendum)
#    Malignant lymphoplasmacytic lymphoma-stage IV low-grade-completed maintenance Rituxan 11/16.  CT 1/17 showed no evidence of disease.   #  STABLE.   No evidence of progression.  Continue surveillance without any additional imaging.  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 STABLE.   # Mild anemia-hemoglobin 12.5;   Likely CKD; recommend PO iron every other day.    # Again discussed regarding screening colonoscopy/follow-up with GI/ declines.   # CKD: Stage III creatinine 1.47 [GFR-53]- STABLE.    # DISPOSITION: # follow up in 6 months-MD/labs cbc/cmp/M- protein/ lkappa-lamda light chains-Dr.B

## 2020-05-11 NOTE — Progress Notes (Signed)
[place] OFFICE PROGRESS NOTE  Patient Care Team: Center, [facility] as PCP - General   SUMMARY OF ONCOLOGIC HISTORY:  . 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 CT [non-contrast]- January 2017- NED  # CKD [stage III creat 1.8]  DIAGNOSIS: lymphoplasmacytic lymphoma/Waldenstroms  STAGE:   IV      ;GOALS: control  CURRENT/MOST RECENT THERAPY: surveillaince   Malignant lymphoma, lymphoplasmacytic (HCC)    INTERVAL HISTORY:  A very pleasant 65 year old male patient with above history of stage IV lymphoplasmacytic lymphoma/marginal zone lymphoma currently status post maintenance rituximab [last treatment November 2016].  Patient denies any new lumps or bumps.  Denies any tingling or numbness.  Denies any nausea vomiting abdominal pain.  He continues to work full-time.  He did not start p.o. iron as recommended last visit because of concerns of constipation.  He canceled his appointment for screening colonoscopy.   Review of Systems  Constitutional: Negative for chills, diaphoresis, fever, malaise/fatigue and weight loss.  HENT: Negative for nosebleeds and sore throat.   Eyes: Negative for double vision.  Respiratory: Negative for cough, hemoptysis, sputum production, shortness of breath and wheezing.   Cardiovascular: Negative for chest pain, palpitations, orthopnea and leg swelling.  Gastrointestinal: Negative for abdominal pain, blood in stool, constipation, diarrhea, heartburn, melena, nausea and vomiting.  Genitourinary: Negative for dysuria, frequency and urgency.  Musculoskeletal: Negative for back pain and joint pain.  Skin: Negative.  Negative for itching  and rash.  Neurological: Negative for dizziness, tingling, focal weakness, weakness and headaches.  Endo/Heme/Allergies: Does not bruise/bleed easily.  Psychiatric/Behavioral: Negative for depression. The patient is not nervous/anxious and does not have insomnia.      PAST MEDICAL HISTORY :  Past Medical History:  Diagnosis Date  . Anemia   . Arthritis   . Chronic kidney disease   . GERD (gastroesophageal reflux disease)   . Non Hodgkin's lymphoma ([place])     PAST SURGICAL HISTORY :   Past Surgical History:  Procedure Laterality Date  . BONE MARROW BIOPSY    . Left eye surgery  2012  . Left knee surgery  1981   ACL repair    FAMILY HISTORY :   Family History  Problem Relation Age of Onset  . Pancreatic cancer Mother 37  . Prostate cancer Brother 72  . Prostate cancer Father 19    SOCIAL HISTORY:   Social History   Tobacco Use  . Smoking status: Current Every Day Smoker    Packs/day: 0.25    Years: 10.00    Pack years: 2.50    Types: Cigarettes  . Smokeless tobacco: Never Used  . Tobacco comment: "I only smoke in the bar on occasion"  Substance Use Topics  . Alcohol use: Yes    Alcohol/week: 0.0 standard drinks    Comment: consumes alcohol on the weekend.  . Drug use: No    ALLERGIES:  is allergic to no known allergies.  MEDICATIONS:  Current Outpatient Medications  Medication Sig Dispense Refill  . pantoprazole (PROTONIX) 40 MG tablet Take 40 mg by mouth daily.   0   No current facility-administered medications for this visit.    PHYSICAL EXAMINATION: ECOG PERFORMANCE STATUS: 0 - Asymptomatic  BP 120/76 (BP Location:  Right Arm, Patient Position: Sitting, Cuff Size: Normal)   Pulse 68   Temp 97.6 F (36.4 C) (Tympanic)   Resp 16   Ht '5\' 6"'  (1.676 m)   Wt 159 lb 3.2 oz (72.2 kg)   SpO2 98%   BMI 25.70 kg/m   Filed Weights   05/11/20 1037  Weight: 159 lb 3.2 oz (72.2 kg)    Physical Exam Constitutional:      Comments: He is alone.   HENT:      Head: Normocephalic and atraumatic.     Mouth/Throat:     Pharynx: No oropharyngeal exudate.  Eyes:     Pupils: Pupils are equal, round, and reactive to light.  Cardiovascular:     Rate and Rhythm: Normal rate and regular rhythm.  Pulmonary:     Effort: No respiratory distress.     Breath sounds: No wheezing.  Abdominal:     General: Bowel sounds are normal. There is no distension.     Palpations: Abdomen is soft. There is no mass.     Tenderness: There is no abdominal tenderness. There is no guarding or rebound.  Musculoskeletal:        General: No tenderness. Normal range of motion.     Cervical back: Normal range of motion and neck supple.  Skin:    General: Skin is warm.  Neurological:     Mental Status: He is alert and oriented to person, place, and time.  Psychiatric:        Mood and Affect: Affect normal.     LABORATORY DATA:  I have reviewed the data as listed    Component Value Date/Time   NA 135 05/11/2020 1016   NA 139 11/08/2013 0824   K 3.9 05/11/2020 1016   K 4.3 11/08/2013 0824   CL 103 05/11/2020 1016   CL 104 11/08/2013 0824   CO2 22 05/11/2020 1016   CO2 27 11/08/2013 0824   GLUCOSE 105 (H) 05/11/2020 1016   GLUCOSE 92 11/08/2013 0824   BUN 21 05/11/2020 1016   BUN 18 11/08/2013 0824   CREATININE 1.47 (H) 05/11/2020 1016   CREATININE 1.85 (H) 04/25/2014 0840   CALCIUM 9.1 05/11/2020 1016   CALCIUM 8.8 11/08/2013 0824   PROT 7.1 05/11/2020 1016   PROT 6.6 02/28/2014 0902   ALBUMIN 3.9 05/11/2020 1016   ALBUMIN 3.5 02/28/2014 0902   AST 19 05/11/2020 1016   AST 14 (L) 02/28/2014 0902   ALT 14 05/11/2020 1016   ALT 19 02/28/2014 0902   ALKPHOS 45 05/11/2020 1016   ALKPHOS 53 02/28/2014 0902   BILITOT 0.4 05/11/2020 1016   BILITOT 0.3 02/28/2014 0902   GFRNONAA 53 (L) 05/11/2020 1016   GFRNONAA 40 (L) 04/25/2014 0840   GFRNONAA 46 (L) 11/08/2013 0824   GFRAA >60 11/08/2019 0915   GFRAA 48 (L) 04/25/2014 0840   GFRAA 53 (L) 11/08/2013  0824    No results found for: SPEP, UPEP  Lab Results  Component Value Date   WBC 6.1 05/11/2020   NEUTROABS 4.0 05/11/2020   HGB 13.1 05/11/2020   HCT 39.2 05/11/2020   MCV 87.9 05/11/2020   PLT 274 05/11/2020      Chemistry      Component Value Date/Time   NA 135 05/11/2020 1016   NA 139 11/08/2013 0824   K 3.9 05/11/2020 1016   K 4.3 11/08/2013 0824   CL 103 05/11/2020 1016   CL 104 11/08/2013 0824   CO2 22 05/11/2020  1016   CO2 27 11/08/2013 0824   BUN 21 05/11/2020 1016   BUN 18 11/08/2013 0824   CREATININE 1.47 (H) 05/11/2020 1016   CREATININE 1.85 (H) 04/25/2014 0840      Component Value Date/Time   CALCIUM 9.1 05/11/2020 1016   CALCIUM 8.8 11/08/2013 0824   ALKPHOS 45 05/11/2020 1016   ALKPHOS 53 02/28/2014 0902   AST 19 05/11/2020 1016   AST 14 (L) 02/28/2014 0902   ALT 14 05/11/2020 1016   ALT 19 02/28/2014 0902   BILITOT 0.4 05/11/2020 1016   BILITOT 0.3 02/28/2014 0902       ASSESSMENT & PLAN:   Malignant lymphoma, lymphoplasmacytic ([place]) #  Malignant lymphoplasmacytic lymphoma-stage IV low-grade-completed maintenance Rituxan 11/16.  CT 1/17 showed no evidence of disease.   #  STABLE.   No evidence of progression.  Continue surveillance without any additional imaging.  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 STABLE.   # Mild anemia-hemoglobin 12.5;   Likely CKD; recommend PO iron every other day.    # Again discussed regarding screening colonoscopy/follow-up with GI/ declines.   # CKD: Stage III creatinine 1.47 [GFR-53]- STABLE.    # DISPOSITION: # follow up in 6 months-MD/labs cbc/cmp/M- protein/ lkappa-lamda light chains-Dr.B     Cammie Sickle, MD 05/11/2020 11:24 AM

## 2020-05-12 LAB — KAPPA/LAMBDA LIGHT CHAINS
Kappa free light chain: 89.7 mg/L — ABNORMAL HIGH (ref 3.3–19.4)
Kappa, lambda light chain ratio: 5.5 — ABNORMAL HIGH (ref 0.26–1.65)
Lambda free light chains: 16.3 mg/L (ref 5.7–26.3)

## 2020-05-13 LAB — MULTIPLE MYELOMA PANEL, SERUM
Albumin SerPl Elph-Mcnc: 3.7 g/dL (ref 2.9–4.4)
Albumin/Glob SerPl: 1.3 (ref 0.7–1.7)
Alpha 1: 0.2 g/dL (ref 0.0–0.4)
Alpha2 Glob SerPl Elph-Mcnc: 0.6 g/dL (ref 0.4–1.0)
B-Globulin SerPl Elph-Mcnc: 0.9 g/dL (ref 0.7–1.3)
Gamma Glob SerPl Elph-Mcnc: 1.2 g/dL (ref 0.4–1.8)
Globulin, Total: 2.9 g/dL (ref 2.2–3.9)
IgA: 152 mg/dL (ref 61–437)
IgG (Immunoglobin G), Serum: 856 mg/dL (ref 603–1613)
IgM (Immunoglobulin M), Srm: 469 mg/dL — ABNORMAL HIGH (ref 20–172)
M Protein SerPl Elph-Mcnc: 0.5 g/dL — ABNORMAL HIGH
Total Protein ELP: 6.6 g/dL (ref 6.0–8.5)

## 2020-05-14 ENCOUNTER — Telehealth: Payer: Self-pay | Admitting: Internal Medicine

## 2020-05-14 NOTE — Telephone Encounter (Signed)
On 2/2-I called patient and spoke results of the slightly elevated myeloma panel.;  Clinically overall stable.  Monitor for now.  Patient agreement.  Follow-up as planned.  GB

## 2020-10-29 ENCOUNTER — Other Ambulatory Visit: Payer: Self-pay | Admitting: *Deleted

## 2020-10-29 DIAGNOSIS — C8300 Small cell B-cell lymphoma, unspecified site: Secondary | ICD-10-CM

## 2020-11-02 ENCOUNTER — Inpatient Hospital Stay (HOSPITAL_BASED_OUTPATIENT_CLINIC_OR_DEPARTMENT_OTHER): Payer: Managed Care, Other (non HMO) | Admitting: Internal Medicine

## 2020-11-02 ENCOUNTER — Encounter: Payer: Self-pay | Admitting: Internal Medicine

## 2020-11-02 ENCOUNTER — Inpatient Hospital Stay: Payer: Managed Care, Other (non HMO) | Attending: Internal Medicine

## 2020-11-02 ENCOUNTER — Other Ambulatory Visit: Payer: Self-pay

## 2020-11-02 DIAGNOSIS — D649 Anemia, unspecified: Secondary | ICD-10-CM | POA: Diagnosis not present

## 2020-11-02 DIAGNOSIS — C8300 Small cell B-cell lymphoma, unspecified site: Secondary | ICD-10-CM

## 2020-11-02 DIAGNOSIS — D472 Monoclonal gammopathy: Secondary | ICD-10-CM | POA: Insufficient documentation

## 2020-11-02 DIAGNOSIS — Z79899 Other long term (current) drug therapy: Secondary | ICD-10-CM | POA: Diagnosis not present

## 2020-11-02 DIAGNOSIS — N183 Chronic kidney disease, stage 3 unspecified: Secondary | ICD-10-CM | POA: Insufficient documentation

## 2020-11-02 DIAGNOSIS — Z8572 Personal history of non-Hodgkin lymphomas: Secondary | ICD-10-CM | POA: Insufficient documentation

## 2020-11-02 LAB — CBC WITH DIFFERENTIAL/PLATELET
Abs Immature Granulocytes: 0.02 10*3/uL (ref 0.00–0.07)
Basophils Absolute: 0 10*3/uL (ref 0.0–0.1)
Basophils Relative: 1 %
Eosinophils Absolute: 0 10*3/uL (ref 0.0–0.5)
Eosinophils Relative: 0 %
HCT: 39.9 % (ref 39.0–52.0)
Hemoglobin: 13.5 g/dL (ref 13.0–17.0)
Immature Granulocytes: 0 %
Lymphocytes Relative: 24 %
Lymphs Abs: 1.7 10*3/uL (ref 0.7–4.0)
MCH: 30.1 pg (ref 26.0–34.0)
MCHC: 33.8 g/dL (ref 30.0–36.0)
MCV: 89.1 fL (ref 80.0–100.0)
Monocytes Absolute: 0.5 10*3/uL (ref 0.1–1.0)
Monocytes Relative: 7 %
Neutro Abs: 4.6 10*3/uL (ref 1.7–7.7)
Neutrophils Relative %: 68 %
Platelets: 223 10*3/uL (ref 150–400)
RBC: 4.48 MIL/uL (ref 4.22–5.81)
RDW: 15.1 % (ref 11.5–15.5)
WBC: 6.8 10*3/uL (ref 4.0–10.5)
nRBC: 0 % (ref 0.0–0.2)

## 2020-11-02 LAB — COMPREHENSIVE METABOLIC PANEL
ALT: 11 U/L (ref 0–44)
AST: 17 U/L (ref 15–41)
Albumin: 4.1 g/dL (ref 3.5–5.0)
Alkaline Phosphatase: 50 U/L (ref 38–126)
Anion gap: 11 (ref 5–15)
BUN: 13 mg/dL (ref 8–23)
CO2: 21 mmol/L — ABNORMAL LOW (ref 22–32)
Calcium: 9 mg/dL (ref 8.9–10.3)
Chloride: 104 mmol/L (ref 98–111)
Creatinine, Ser: 1.18 mg/dL (ref 0.61–1.24)
GFR, Estimated: >60 mL/min (ref >60)
Glucose, Bld: 92 mg/dL (ref 70–99)
Potassium: 3.3 mmol/L — ABNORMAL LOW (ref 3.5–5.1)
Sodium: 136 mmol/L (ref 135–145)
Total Bilirubin: 0.7 mg/dL (ref 0.3–1.2)
Total Protein: 7.3 g/dL (ref 6.5–8.1)

## 2020-11-02 NOTE — Progress Notes (Signed)
[facility] OFFICE PROGRESS NOTE  Patient Care Team: Center, [facility] as PCP - General   SUMMARY OF ONCOLOGIC HISTORY:  . 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 CT [non-contrast]- January 2017- NED  # CKD [stage III creat 1.8]  DIAGNOSIS: lymphoplasmacytic lymphoma/Waldenstroms  STAGE:   IV      ;GOALS: control  CURRENT/MOST RECENT THERAPY: surveillaince   Malignant lymphoma, lymphoplasmacytic (HCC)    INTERVAL HISTORY:  A very pleasant 64 year old male patient with above history of stage IV lymphoplasmacytic lymphoma/marginal zone lymphoma currently status post maintenance rituximab [last treatment November 2016].  Continues to be physically active/still working.  Awaiting retirement in October 2022.  Denies any new lumps or bumps.  Denies any tingling or numbness.  No nausea or vomiting.  No abdominal pain.   Review of Systems  Constitutional:  Negative for chills, diaphoresis, fever, malaise/fatigue and weight loss.  HENT:  Negative for nosebleeds and sore throat.   Eyes:  Negative for double vision.  Respiratory:  Negative for cough, hemoptysis, sputum production, shortness of breath and wheezing.   Cardiovascular:  Negative for chest pain, palpitations, orthopnea and leg swelling.  Gastrointestinal:  Negative for abdominal pain, blood in stool, constipation, diarrhea, heartburn, melena, nausea and vomiting.  Genitourinary:  Negative for dysuria, frequency and urgency.  Musculoskeletal:  Negative for back pain and joint pain.  Skin: Negative.  Negative for itching and rash.  Neurological:  Negative for dizziness, tingling, focal weakness, weakness and  headaches.  Endo/Heme/Allergies:  Does not bruise/bleed easily.  Psychiatric/Behavioral:  Negative for depression. The patient is not nervous/anxious and does not have insomnia.     PAST MEDICAL HISTORY :  Past Medical History:  Diagnosis Date   Anemia    Arthritis    Chronic kidney disease    GERD (gastroesophageal reflux disease)    Non Hodgkin's lymphoma ([place])     PAST SURGICAL HISTORY :   Past Surgical History:  Procedure Laterality Date   BONE MARROW BIOPSY     Left eye surgery  2012   Left knee surgery  1981   ACL repair    FAMILY HISTORY :   Family History  Problem Relation Age of Onset   Pancreatic cancer Mother 38   Prostate cancer Brother 51   Prostate cancer Father 22    SOCIAL HISTORY:   Social History   Tobacco Use   Smoking status: Every Day    Packs/day: 0.25    Years: 10.00    Pack years: 2.50    Types: Cigarettes   Smokeless tobacco: Never   Tobacco comments:    "I only smoke in the bar on occasion"  Substance Use Topics   Alcohol use: Yes    Alcohol/week: 0.0 standard drinks    Comment: consumes alcohol on the weekend.   Drug use: No    ALLERGIES:  is allergic to no known allergies.  MEDICATIONS:  Current Outpatient Medications  Medication Sig Dispense Refill   pantoprazole (PROTONIX) 40 MG tablet Take 40 mg by mouth daily.   0   No current facility-administered medications for this visit.    PHYSICAL EXAMINATION: ECOG PERFORMANCE STATUS: 0 - Asymptomatic  BP 132/85 (BP Location: Right Arm, Patient Position: Sitting, Cuff  Size: Normal)   Pulse 77   Temp 98.2 F (36.8 C) (Tympanic)   Resp 16   Ht '5\' 6"'  (1.676 m)   Wt 150 lb (68 kg)   SpO2 98%   BMI 24.21 kg/m   Filed Weights   11/02/20 1013  Weight: 150 lb (68 kg)    Physical Exam Constitutional:      Comments: He is alone.   HENT:     Head: Normocephalic and atraumatic.     Mouth/Throat:     Pharynx: No oropharyngeal exudate.  Eyes:     Pupils: Pupils are equal,  round, and reactive to light.  Cardiovascular:     Rate and Rhythm: Normal rate and regular rhythm.  Pulmonary:     Effort: No respiratory distress.     Breath sounds: No wheezing.  Abdominal:     General: Bowel sounds are normal. There is no distension.     Palpations: Abdomen is soft. There is no mass.     Tenderness: no abdominal tenderness There is no guarding or rebound.  Musculoskeletal:        General: No tenderness. Normal range of motion.     Cervical back: Normal range of motion and neck supple.  Skin:    General: Skin is warm.  Neurological:     Mental Status: He is alert and oriented to person, place, and time.  Psychiatric:        Mood and Affect: Affect normal.    LABORATORY DATA:  I have reviewed the data as listed    Component Value Date/Time   NA 136 11/02/2020 1000   NA 139 11/08/2013 0824   K 3.3 (L) 11/02/2020 1000   K 4.3 11/08/2013 0824   CL 104 11/02/2020 1000   CL 104 11/08/2013 0824   CO2 21 (L) 11/02/2020 1000   CO2 27 11/08/2013 0824   GLUCOSE 92 11/02/2020 1000   GLUCOSE 92 11/08/2013 0824   BUN 13 11/02/2020 1000   BUN 18 11/08/2013 0824   CREATININE 1.18 11/02/2020 1000   CREATININE 1.85 (H) 04/25/2014 0840   CALCIUM 9.0 11/02/2020 1000   CALCIUM 8.8 11/08/2013 0824   PROT 7.3 11/02/2020 1000   PROT 6.6 02/28/2014 0902   ALBUMIN 4.1 11/02/2020 1000   ALBUMIN 3.5 02/28/2014 0902   AST 17 11/02/2020 1000   AST 14 (L) 02/28/2014 0902   ALT 11 11/02/2020 1000   ALT 19 02/28/2014 0902   ALKPHOS 50 11/02/2020 1000   ALKPHOS 53 02/28/2014 0902   BILITOT 0.7 11/02/2020 1000   BILITOT 0.3 02/28/2014 0902   GFRNONAA >60 11/02/2020 1000   GFRNONAA 40 (L) 04/25/2014 0840   GFRNONAA 46 (L) 11/08/2013 0824   GFRAA >60 11/08/2019 0915   GFRAA 48 (L) 04/25/2014 0840   GFRAA 53 (L) 11/08/2013 0824    No results found for: SPEP, UPEP  Lab Results  Component Value Date   WBC 6.8 11/02/2020   NEUTROABS 4.6 11/02/2020   HGB 13.5 11/02/2020    HCT 39.9 11/02/2020   MCV 89.1 11/02/2020   PLT 223 11/02/2020      Chemistry      Component Value Date/Time   NA 136 11/02/2020 1000   NA 139 11/08/2013 0824   K 3.3 (L) 11/02/2020 1000   K 4.3 11/08/2013 0824   CL 104 11/02/2020 1000   CL 104 11/08/2013 0824   CO2 21 (L) 11/02/2020 1000   CO2 27 11/08/2013 0824   BUN 13 11/02/2020  1000   BUN 18 11/08/2013 0824   CREATININE 1.18 11/02/2020 1000   CREATININE 1.85 (H) 04/25/2014 0840      Component Value Date/Time   CALCIUM 9.0 11/02/2020 1000   CALCIUM 8.8 11/08/2013 0824   ALKPHOS 50 11/02/2020 1000   ALKPHOS 53 02/28/2014 0902   AST 17 11/02/2020 1000   AST 14 (L) 02/28/2014 0902   ALT 11 11/02/2020 1000   ALT 19 02/28/2014 0902   BILITOT 0.7 11/02/2020 1000   BILITOT 0.3 02/28/2014 0902       ASSESSMENT & PLAN:   Malignant lymphoma, lymphoplasmacytic ([place]) #  Malignant lymphoplasmacytic lymphoma-stage IV low-grade-completed maintenance Rituxan 11/16.  CT 1/17 showed no evidence of disease.   #  STABLE.   No evidence of progression.  Continue surveillance without any additional imaging.  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  STABLE.    # Mild anemia-hemoglobin 13.5-;  Likely CKD; recommend PO iron every other day. STABLE.  # screening colonosopy: finally agreesgain discussed regarding screening colonoscopy/follow-up with GI/   # CKD: StageII- III creatinine 1.2; imprving GFR > 60 .    # intentionl weight loss-congratulated patient on losing weight intentionally.  Discussed importance of healthy weight/and weight loss.  Strongly recommend eating more green leafy vegetables and cutting down processed food/ carbohydrates.  Instead increasing whole grains / protein in the diet.  Multiple studies have shown that optimal weight would help improve cardiovascular risk; also shown to cut on the risk of malignancies-colon cancer, breast  cancer ovarian/uterine cancer in women and also prostate cancer in men.   # DISPOSITION: # refer to Dr.Anna re: screening colonscopy.  # follow up in 6 months-MD/labs cbc/cmp/M- protein/ lkappa-lamda light chains-Dr.B    Cammie Sickle, MD 11/03/2020 8:17 AM

## 2020-11-02 NOTE — Assessment & Plan Note (Addendum)
#    Malignant lymphoplasmacytic lymphoma-stage IV low-grade-completed maintenance Rituxan 11/16.  CT 1/17 showed no evidence of disease.   #  STABLE.   No evidence of progression.  Continue surveillance without any additional imaging.  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  STABLE.    # Mild anemia-hemoglobin 13.5-;  Likely CKD; recommend PO iron every other day. STABLE.  # screening colonosopy: finally agreesgain discussed regarding screening colonoscopy/follow-up with GI/   # CKD: StageII- III creatinine 1.2; imprving GFR > 60 .    # intentionl weight loss-congratulated patient on losing weight intentionally.  Discussed importance of healthy weight/and weight loss.  Strongly recommend eating more green leafy vegetables and cutting down processed food/ carbohydrates.  Instead increasing whole grains / protein in the diet.  Multiple studies have shown that optimal weight would help improve cardiovascular risk; also shown to cut on the risk of malignancies-colon cancer, breast cancer ovarian/uterine cancer in women and also prostate cancer in men.   # DISPOSITION: # refer to Dr.Anna re: screening colonscopy.  # follow up in 6 months-MD/labs cbc/cmp/M- protein/ lkappa-lamda light chains-Dr.B

## 2020-11-03 LAB — KAPPA/LAMBDA LIGHT CHAINS
Kappa free light chain: 105.6 mg/L — ABNORMAL HIGH (ref 3.3–19.4)
Kappa, lambda light chain ratio: 6.44 — ABNORMAL HIGH (ref 0.26–1.65)
Lambda free light chains: 16.4 mg/L (ref 5.7–26.3)

## 2020-11-06 LAB — MULTIPLE MYELOMA PANEL, SERUM
Albumin SerPl Elph-Mcnc: 4.1 g/dL (ref 2.9–4.4)
Albumin/Glob SerPl: 1.5 (ref 0.7–1.7)
Alpha 1: 0.2 g/dL (ref 0.0–0.4)
Alpha2 Glob SerPl Elph-Mcnc: 0.6 g/dL (ref 0.4–1.0)
B-Globulin SerPl Elph-Mcnc: 0.9 g/dL (ref 0.7–1.3)
Gamma Glob SerPl Elph-Mcnc: 1.2 g/dL (ref 0.4–1.8)
Globulin, Total: 2.9 g/dL (ref 2.2–3.9)
IgA: 158 mg/dL (ref 61–437)
IgG (Immunoglobin G), Serum: 822 mg/dL (ref 603–1613)
IgM (Immunoglobulin M), Srm: 532 mg/dL — ABNORMAL HIGH (ref 20–172)
M Protein SerPl Elph-Mcnc: 0.5 g/dL — ABNORMAL HIGH
Total Protein ELP: 7 g/dL (ref 6.0–8.5)

## 2021-04-27 ENCOUNTER — Other Ambulatory Visit: Payer: Self-pay | Admitting: *Deleted

## 2021-04-27 DIAGNOSIS — C8300 Small cell B-cell lymphoma, unspecified site: Secondary | ICD-10-CM

## 2021-05-03 ENCOUNTER — Inpatient Hospital Stay: Payer: Medicare Other

## 2021-05-03 ENCOUNTER — Telehealth: Payer: Self-pay | Admitting: Internal Medicine

## 2021-05-03 ENCOUNTER — Inpatient Hospital Stay: Payer: Medicare Other | Admitting: Internal Medicine

## 2021-05-03 NOTE — Telephone Encounter (Signed)
Patient left message with Answering service to cancel her 1/23 appointments.  She is out of town and will call back to reschedule.

## 2021-06-30 ENCOUNTER — Telehealth: Payer: Self-pay | Admitting: Oncology

## 2021-06-30 NOTE — Telephone Encounter (Signed)
Multiple attempts have been made to reach the patient from his listed demographics information. Unable to leave messages with him or his sister number to due to VM availability. Appt on 3/27 need to be rescheduled due to the provider being out of office. ?

## 2021-07-05 ENCOUNTER — Other Ambulatory Visit: Payer: Self-pay

## 2021-07-05 ENCOUNTER — Telehealth: Payer: Self-pay | Admitting: *Deleted

## 2021-07-05 ENCOUNTER — Inpatient Hospital Stay (HOSPITAL_BASED_OUTPATIENT_CLINIC_OR_DEPARTMENT_OTHER): Payer: Medicare Other | Admitting: Nurse Practitioner

## 2021-07-05 ENCOUNTER — Inpatient Hospital Stay: Payer: Medicare Other | Attending: Internal Medicine

## 2021-07-05 ENCOUNTER — Encounter: Payer: Self-pay | Admitting: Nurse Practitioner

## 2021-07-05 VITALS — BP 96/61 | HR 79 | Temp 98.7°F | Resp 19 | Wt 148.8 lb

## 2021-07-05 DIAGNOSIS — D3110 Benign neoplasm of unspecified cornea: Secondary | ICD-10-CM | POA: Insufficient documentation

## 2021-07-05 DIAGNOSIS — M2550 Pain in unspecified joint: Secondary | ICD-10-CM | POA: Insufficient documentation

## 2021-07-05 DIAGNOSIS — R634 Abnormal weight loss: Secondary | ICD-10-CM | POA: Insufficient documentation

## 2021-07-05 DIAGNOSIS — C8300 Small cell B-cell lymphoma, unspecified site: Secondary | ICD-10-CM | POA: Diagnosis not present

## 2021-07-05 DIAGNOSIS — Z1211 Encounter for screening for malignant neoplasm of colon: Secondary | ICD-10-CM | POA: Diagnosis not present

## 2021-07-05 DIAGNOSIS — F17200 Nicotine dependence, unspecified, uncomplicated: Secondary | ICD-10-CM | POA: Insufficient documentation

## 2021-07-05 DIAGNOSIS — D649 Anemia, unspecified: Secondary | ICD-10-CM | POA: Diagnosis not present

## 2021-07-05 DIAGNOSIS — K006 Disturbances in tooth eruption: Secondary | ICD-10-CM | POA: Insufficient documentation

## 2021-07-05 DIAGNOSIS — Z79899 Other long term (current) drug therapy: Secondary | ICD-10-CM | POA: Diagnosis not present

## 2021-07-05 DIAGNOSIS — D472 Monoclonal gammopathy: Secondary | ICD-10-CM | POA: Insufficient documentation

## 2021-07-05 DIAGNOSIS — Z8572 Personal history of non-Hodgkin lymphomas: Secondary | ICD-10-CM | POA: Diagnosis present

## 2021-07-05 DIAGNOSIS — K219 Gastro-esophageal reflux disease without esophagitis: Secondary | ICD-10-CM | POA: Insufficient documentation

## 2021-07-05 DIAGNOSIS — M25562 Pain in left knee: Secondary | ICD-10-CM | POA: Insufficient documentation

## 2021-07-05 DIAGNOSIS — K009 Disorder of tooth development, unspecified: Secondary | ICD-10-CM | POA: Insufficient documentation

## 2021-07-05 DIAGNOSIS — N183 Chronic kidney disease, stage 3 unspecified: Secondary | ICD-10-CM | POA: Diagnosis not present

## 2021-07-05 LAB — COMPREHENSIVE METABOLIC PANEL
ALT: 13 U/L (ref 0–44)
AST: 23 U/L (ref 15–41)
Albumin: 3.9 g/dL (ref 3.5–5.0)
Alkaline Phosphatase: 49 U/L (ref 38–126)
Anion gap: 6 (ref 5–15)
BUN: 19 mg/dL (ref 8–23)
CO2: 25 mmol/L (ref 22–32)
Calcium: 9.2 mg/dL (ref 8.9–10.3)
Chloride: 106 mmol/L (ref 98–111)
Creatinine, Ser: 1.5 mg/dL — ABNORMAL HIGH (ref 0.61–1.24)
GFR, Estimated: 51 mL/min — ABNORMAL LOW (ref >60)
Glucose, Bld: 106 mg/dL — ABNORMAL HIGH (ref 70–99)
Potassium: 3.9 mmol/L (ref 3.5–5.1)
Sodium: 137 mmol/L (ref 135–145)
Total Bilirubin: 0.3 mg/dL (ref 0.3–1.2)
Total Protein: 7.2 g/dL (ref 6.5–8.1)

## 2021-07-05 LAB — CBC WITH DIFFERENTIAL/PLATELET
Abs Immature Granulocytes: 0.03 10*3/uL (ref 0.00–0.07)
Basophils Absolute: 0.1 10*3/uL (ref 0.0–0.1)
Basophils Relative: 1 %
Eosinophils Absolute: 0.1 10*3/uL (ref 0.0–0.5)
Eosinophils Relative: 1 %
HCT: 39.8 % (ref 39.0–52.0)
Hemoglobin: 13 g/dL (ref 13.0–17.0)
Immature Granulocytes: 0 %
Lymphocytes Relative: 26 %
Lymphs Abs: 1.8 10*3/uL (ref 0.7–4.0)
MCH: 29.4 pg (ref 26.0–34.0)
MCHC: 32.7 g/dL (ref 30.0–36.0)
MCV: 90 fL (ref 80.0–100.0)
Monocytes Absolute: 0.7 10*3/uL (ref 0.1–1.0)
Monocytes Relative: 9 %
Neutro Abs: 4.3 10*3/uL (ref 1.7–7.7)
Neutrophils Relative %: 63 %
Platelets: 267 10*3/uL (ref 150–400)
RBC: 4.42 MIL/uL (ref 4.22–5.81)
RDW: 15.4 % (ref 11.5–15.5)
WBC: 7 10*3/uL (ref 4.0–10.5)
nRBC: 0 % (ref 0.0–0.2)

## 2021-07-05 NOTE — Progress Notes (Signed)
Patient states he is always tired. ? ?

## 2021-07-05 NOTE — Telephone Encounter (Signed)
Appears that patient attempted to call cancer center scheduling line this morning. Phone number showed that patient tried to call. The vm only had "static and rattling sounds in the background and no one speaking." I reviewed that chart. Appears that Colette had tried multiple times to reach patient to discuss r/s his apt with Sonia Baller, NP (that was scheduled today).  ? ?I attempted to reach patient via return phone call. No answer. Unable to leave vm. ?

## 2021-07-05 NOTE — Progress Notes (Signed)
[place] ?OFFICE PROGRESS NOTE ? ?Patient Care Team: ?Center, [facility] as PCP - General ? ? ?SUMMARY OF ONCOLOGIC HISTORY: ? 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 CT [non-contrast]- January 2017- NED ? ?# CKD [stage III creat 1.8] ? ?DIAGNOSIS: lymphoplasmacytic lymphoma/Waldenstroms ? ?STAGE:   IV      ;GOALS: control ? ?CURRENT/MOST RECENT THERAPY: surveillaince ?  ?Malignant lymphoma, lymphoplasmacytic ([place])  ? ? ?INTERVAL HISTORY:  A very pleasant 66 year old male patient with above history of stage IV lymphoplasmacytic lymphoma/marginal zone lymphoma currently status post maintenance rituximab [last treatment November 2016] returns to clinic for follow up.  ? ?He has retired but has started working at [employer] and volunteers. Stays physically active. No new lumps or bumps. Denies tingling or numbness. No nausea, vomiting, or abdominal pain.  ? ?Review of Systems  ?Constitutional:  Negative for chills, diaphoresis, fever, malaise/fatigue and weight loss.  ?HENT:  Negative for nosebleeds and sore throat.   ?Eyes:  Negative for double vision.  ?Respiratory:  Negative for cough, hemoptysis, sputum production, shortness of breath and wheezing.   ?Cardiovascular:  Negative for chest pain, palpitations, orthopnea and leg swelling.  ?Gastrointestinal:  Negative for abdominal pain, blood in stool, constipation, diarrhea, heartburn, melena, nausea and vomiting.  ?Genitourinary:  Negative for dysuria, frequency and urgency.  ?Musculoskeletal:  Negative for back pain and joint pain.  ?Skin: Negative.  Negative for itching and rash.  ?Neurological:  Negative for dizziness, tingling, focal weakness,  weakness and headaches.  ?Endo/Heme/Allergies:  Does not bruise/bleed easily.  ?Psychiatric/Behavioral:  Negative for depression. The patient is not nervous/anxious and does not have insomnia.   ? ? ?PAST MEDICAL HISTORY :  ?Past Medical History:  ?Diagnosis Date  ? Anemia   ? Arthritis   ? Chronic kidney disease   ? GERD (gastroesophageal reflux disease)   ? Non Hodgkin's lymphoma ([place])   ? ? ?PAST SURGICAL HISTORY :   ?Past Surgical History:  ?Procedure Laterality Date  ? BONE MARROW BIOPSY    ? Left eye surgery  2012  ? Left knee surgery  1981  ? ACL repair  ? ? ?FAMILY HISTORY :   ?Family History  ?Problem Relation Age of Onset  ? Pancreatic cancer Mother 95  ? Prostate cancer Brother 71  ? Prostate cancer Father 49  ? ? ?SOCIAL HISTORY:   ?Social History  ? ?Tobacco Use  ? Smoking status: Every Day  ?  Packs/day: 0.25  ?  Years: 10.00  ?  Pack years: 2.50  ?  Types: Cigarettes  ? Smokeless tobacco: Never  ? Tobacco comments:  ?  "I only smoke in the bar on occasion"  ?Substance Use Topics  ? Alcohol use: Yes  ?  Alcohol/week: 0.0 standard drinks  ?  Comment: consumes alcohol on the weekend.  ? Drug use: No  ? ? ?ALLERGIES:  is allergic to no known allergies. ? ?MEDICATIONS:  ?Current Outpatient Medications  ?Medication Sig Dispense Refill  ? omeprazole (PRILOSEC) 20 MG capsule TAKE ONE CAPSULE BY MOUTH EVERY MORNING TO CONTROL STOMACH ACID.  TAKE 30 MINUTES BEFORE A MEAL    ? pantoprazole (PROTONIX) 40 MG tablet Take 40 mg by mouth daily.   0  ? ?  No current facility-administered medications for this visit.  ? ? ?PHYSICAL EXAMINATION: ?ECOG PERFORMANCE STATUS: 0 - Asymptomatic ? ?BP 96/61   Pulse 79   Temp 98.7 ?F (37.1 ?C)   Resp 19   Wt 148 lb 12.8 oz (67.5 kg)   SpO2 97%   BMI 24.02 kg/m?  ? Danley Danker Weights  ? 07/05/21 1457  ?Weight: 148 lb 12.8 oz (67.5 kg)  ? ? ?Physical Exam ?Constitutional:   ?   Comments: He is alone.   ?HENT:  ?   Head: Normocephalic and atraumatic.  ?   Mouth/Throat:  ?   Pharynx:  No oropharyngeal exudate.  ?Eyes:  ?   Pupils: Pupils are equal, round, and reactive to light.  ?Cardiovascular:  ?   Rate and Rhythm: Normal rate and regular rhythm.  ?Pulmonary:  ?   Effort: No respiratory distress.  ?   Breath sounds: No wheezing.  ?Abdominal:  ?   General: Bowel sounds are normal. There is no distension.  ?   Palpations: Abdomen is soft. There is no mass.  ?   Tenderness: There is no abdominal tenderness. There is no guarding or rebound.  ?Musculoskeletal:     ?   General: No tenderness. Normal range of motion.  ?   Cervical back: Normal range of motion and neck supple.  ?Skin: ?   General: Skin is warm.  ?Neurological:  ?   Mental Status: He is alert and oriented to person, place, and time.  ?Psychiatric:     ?   Mood and Affect: Affect normal.  ? ? ?LABORATORY DATA:  ?I have reviewed the data as listed ?   ?Component Value Date/Time  ? NA 137 07/05/2021 1431  ? NA 139 11/08/2013 0824  ? K 3.9 07/05/2021 1431  ? K 4.3 11/08/2013 0824  ? CL 106 07/05/2021 1431  ? CL 104 11/08/2013 0824  ? CO2 25 07/05/2021 1431  ? CO2 27 11/08/2013 0824  ? GLUCOSE 106 (H) 07/05/2021 1431  ? GLUCOSE 92 11/08/2013 0824  ? BUN 19 07/05/2021 1431  ? BUN 18 11/08/2013 0824  ? CREATININE 1.50 (H) 07/05/2021 1431  ? CREATININE 1.85 (H) 04/25/2014 0840  ? CALCIUM 9.2 07/05/2021 1431  ? CALCIUM 8.8 11/08/2013 0824  ? PROT 7.2 07/05/2021 1431  ? PROT 6.6 02/28/2014 0902  ? ALBUMIN 3.9 07/05/2021 1431  ? ALBUMIN 3.5 02/28/2014 0902  ? AST 23 07/05/2021 1431  ? AST 14 (L) 02/28/2014 0902  ? ALT 13 07/05/2021 1431  ? ALT 19 02/28/2014 0902  ? ALKPHOS 49 07/05/2021 1431  ? ALKPHOS 53 02/28/2014 0902  ? BILITOT 0.3 07/05/2021 1431  ? BILITOT 0.3 02/28/2014 0902  ? GFRNONAA 51 (L) 07/05/2021 1431  ? GFRNONAA 40 (L) 04/25/2014 0840  ? GFRNONAA 46 (L) 11/08/2013 6269  ? GFRAA >60 11/08/2019 0915  ? GFRAA 48 (L) 04/25/2014 0840  ? GFRAA 53 (L) 11/08/2013 0824  ? ? ?No results found for: SPEP, UPEP ? ?Lab Results  ?Component Value  Date  ? WBC 7.0 07/05/2021  ? NEUTROABS 4.3 07/05/2021  ? HGB 13.0 07/05/2021  ? HCT 39.8 07/05/2021  ? MCV 90.0 07/05/2021  ? PLT 267 07/05/2021  ? ? ?  Chemistry   ?   ?Component Value Date/Time  ? NA 137 07/05/2021 1431  ? NA 139 11/08/2013 0824  ? K 3.9 07/05/2021 1431  ? K 4.3 11/08/2013 0824  ? CL 106 07/05/2021 1431  ? CL 104 11/08/2013 0824  ?  CO2 25 07/05/2021 1431  ? CO2 27 11/08/2013 0824  ? BUN 19 07/05/2021 1431  ? BUN 18 11/08/2013 0824  ? CREATININE 1.50 (H) 07/05/2021 1431  ? CREATININE 1.85 (H) 04/25/2014 0840  ?    ?Component Value Date/Time  ? CALCIUM 9.2 07/05/2021 1431  ? CALCIUM 8.8 11/08/2013 0824  ? ALKPHOS 49 07/05/2021 1431  ? ALKPHOS 53 02/28/2014 0902  ? AST 23 07/05/2021 1431  ? AST 14 (L) 02/28/2014 0902  ? ALT 13 07/05/2021 1431  ? ALT 19 02/28/2014 0902  ? BILITOT 0.3 07/05/2021 1431  ? BILITOT 0.3 02/28/2014 0902  ?  ? ? ? ?ASSESSMENT & PLAN:  ? ?No problem-specific Assessment & Plan notes found for this encounter. ? ?#  Malignant lymphoplasmacytic lymphoma-stage IV low-grade-completed maintenance Rituxan 11/16.  CT 1/17 showed no evidence of disease.  ?  ?#  STABLE.   No evidence of progression.  Continue surveillance without any additional imaging. ?  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 MM panel pending at time of dictation. STABLE.   ?  ?# Mild anemia-hemoglobin 13.5-;  Likely CKD; recommend PO iron every other day. STABLE. ?  ?# screening colonosopy: referred and was scheduled for colonoscopy with Dr. Vicente Males but cancelled. Encouraged him to reconsider.  ?  ?# CKD: StageII- III creatinine 1.5. GFR 51. Baseline. Encouraged hydration.  ?  ?# intentional weight loss- patient has been successful in keeping weight off.   ? ?# Cancer screenings- patient has not had colonoscopy. Recommended and reviewed rationale for colonoscopy vs other modalities. Agrees to colonoscopy. Referral sent.   ?  ?# DISPOSITION: ?# refer to Dr.Anna re: screening colonscopy.  ?# follow up in 6 months-MD/labs cbc/cmp/M- protein/kappa-lamda light chains-Dr.B ? ? Verlon Au, NP ?07/05/2021  ? ?CC: dr. Rogue Bussing

## 2021-07-06 LAB — KAPPA/LAMBDA LIGHT CHAINS
Kappa free light chain: 123.2 mg/L — ABNORMAL HIGH (ref 3.3–19.4)
Kappa, lambda light chain ratio: 8 — ABNORMAL HIGH (ref 0.26–1.65)
Lambda free light chains: 15.4 mg/L (ref 5.7–26.3)

## 2021-07-07 ENCOUNTER — Other Ambulatory Visit: Payer: Self-pay

## 2021-07-07 DIAGNOSIS — Z1211 Encounter for screening for malignant neoplasm of colon: Secondary | ICD-10-CM

## 2021-07-07 MED ORDER — NA SULFATE-K SULFATE-MG SULF 17.5-3.13-1.6 GM/177ML PO SOLN: 1.0000 | Freq: Once | ORAL | 0 refills | Status: AC

## 2021-07-07 NOTE — Progress Notes (Signed)
Gastroenterology Pre-Procedure Review ? ?Request Date: 08/03/2021 ?Requesting Physician: Dr. Vicente Males ? ?PATIENT REVIEW QUESTIONS: The patient responded to the following health history questions as indicated:   ? ?1. Are you having any GI issues? no ?2. Do you have a personal history of Polyps? no ?3. Do you have a family history of Colon Cancer or Polyps? no ?4. Diabetes Mellitus? no ?5. Joint replacements in the past 12 months?no ?6. Major health problems in the past 3 months?no ?7. Any artificial heart valves, MVP, or defibrillator?no ?   ?MEDICATIONS & ALLERGIES:    ?Patient reports the following regarding taking any anticoagulation/antiplatelet therapy:   ?Plavix, Coumadin, Eliquis, Xarelto, Lovenox, Pradaxa, Brilinta, or Effient? no ?Aspirin? no ? ?Patient confirms/reports the following medications:  ?Current Outpatient Medications  ?Medication Sig Dispense Refill  ? omeprazole (PRILOSEC) 20 MG capsule TAKE ONE CAPSULE BY MOUTH EVERY MORNING TO CONTROL STOMACH ACID.  TAKE 30 MINUTES BEFORE A MEAL    ? ?No current facility-administered medications for this visit.  ? ? ?Patient confirms/reports the following allergies:  ?Allergies  ?Allergen Reactions  ? No Known Allergies   ? ? ?No orders of the defined types were placed in this encounter. ? ? ?AUTHORIZATION INFORMATION ?Primary Insurance: ?1D#: ?Group #: ? ?Secondary Insurance: ?1D#: ?Group #: ? ?SCHEDULE INFORMATION: ?Date: 08/03/2021 ?Time: ?Location:[facility] ? ?

## 2021-07-09 LAB — MULTIPLE MYELOMA PANEL, SERUM
Albumin SerPl Elph-Mcnc: 3.6 g/dL (ref 2.9–4.4)
Albumin/Glob SerPl: 1.3 (ref 0.7–1.7)
Alpha 1: 0.2 g/dL (ref 0.0–0.4)
Alpha2 Glob SerPl Elph-Mcnc: 0.6 g/dL (ref 0.4–1.0)
B-Globulin SerPl Elph-Mcnc: 0.9 g/dL (ref 0.7–1.3)
Gamma Glob SerPl Elph-Mcnc: 1.2 g/dL (ref 0.4–1.8)
Globulin, Total: 2.9 g/dL (ref 2.2–3.9)
IgA: 152 mg/dL (ref 61–437)
IgG (Immunoglobin G), Serum: 871 mg/dL (ref 603–1613)
IgM (Immunoglobulin M), Srm: 600 mg/dL — ABNORMAL HIGH (ref 20–172)
M Protein SerPl Elph-Mcnc: 0.6 g/dL — ABNORMAL HIGH
Total Protein ELP: 6.5 g/dL (ref 6.0–8.5)

## 2021-08-03 ENCOUNTER — Encounter: Payer: Self-pay | Admitting: Registered Nurse

## 2021-08-03 ENCOUNTER — Encounter: Admission: RE | Payer: Self-pay | Source: Ambulatory Visit

## 2021-08-03 ENCOUNTER — Ambulatory Visit: Admission: RE | Admit: 2021-08-03 | Payer: Medicare Other | Source: Ambulatory Visit | Admitting: Gastroenterology

## 2021-08-03 SURGERY — COLONOSCOPY WITH PROPOFOL
Anesthesia: General

## 2021-11-23 ENCOUNTER — Ambulatory Visit (LOCAL_COMMUNITY_HEALTH_CENTER): Payer: Self-pay

## 2021-11-23 ENCOUNTER — Other Ambulatory Visit: Payer: Self-pay

## 2021-11-23 DIAGNOSIS — Z111 Encounter for screening for respiratory tuberculosis: Secondary | ICD-10-CM

## 2021-11-26 ENCOUNTER — Ambulatory Visit (LOCAL_COMMUNITY_HEALTH_CENTER): Payer: Self-pay

## 2021-11-26 DIAGNOSIS — Z111 Encounter for screening for respiratory tuberculosis: Secondary | ICD-10-CM

## 2021-11-26 LAB — TB SKIN TEST
Induration: 0 mm
TB Skin Test: NEGATIVE

## 2022-01-05 ENCOUNTER — Inpatient Hospital Stay: Payer: Medicare Other | Attending: Oncology

## 2022-01-05 DIAGNOSIS — Z79899 Other long term (current) drug therapy: Secondary | ICD-10-CM | POA: Diagnosis not present

## 2022-01-05 DIAGNOSIS — N183 Chronic kidney disease, stage 3 unspecified: Secondary | ICD-10-CM | POA: Insufficient documentation

## 2022-01-05 DIAGNOSIS — D649 Anemia, unspecified: Secondary | ICD-10-CM | POA: Insufficient documentation

## 2022-01-05 DIAGNOSIS — C8300 Small cell B-cell lymphoma, unspecified site: Secondary | ICD-10-CM

## 2022-01-05 DIAGNOSIS — Z8572 Personal history of non-Hodgkin lymphomas: Secondary | ICD-10-CM | POA: Insufficient documentation

## 2022-01-05 DIAGNOSIS — R634 Abnormal weight loss: Secondary | ICD-10-CM | POA: Insufficient documentation

## 2022-01-05 DIAGNOSIS — D472 Monoclonal gammopathy: Secondary | ICD-10-CM | POA: Insufficient documentation

## 2022-01-05 LAB — CBC WITH DIFFERENTIAL/PLATELET
Abs Immature Granulocytes: 0.02 10*3/uL (ref 0.00–0.07)
Basophils Absolute: 0.1 10*3/uL (ref 0.0–0.1)
Basophils Relative: 1 %
Eosinophils Absolute: 0.2 10*3/uL (ref 0.0–0.5)
Eosinophils Relative: 3 %
HCT: 39.7 % (ref 39.0–52.0)
Hemoglobin: 13 g/dL (ref 13.0–17.0)
Immature Granulocytes: 0 %
Lymphocytes Relative: 37 %
Lymphs Abs: 2.3 10*3/uL (ref 0.7–4.0)
MCH: 29.7 pg (ref 26.0–34.0)
MCHC: 32.7 g/dL (ref 30.0–36.0)
MCV: 90.8 fL (ref 80.0–100.0)
Monocytes Absolute: 0.7 10*3/uL (ref 0.1–1.0)
Monocytes Relative: 11 %
Neutro Abs: 2.9 10*3/uL (ref 1.7–7.7)
Neutrophils Relative %: 48 %
Platelets: 289 10*3/uL (ref 150–400)
RBC: 4.37 MIL/uL (ref 4.22–5.81)
RDW: 14.7 % (ref 11.5–15.5)
WBC: 6.2 10*3/uL (ref 4.0–10.5)
nRBC: 0 % (ref 0.0–0.2)

## 2022-01-05 LAB — COMPREHENSIVE METABOLIC PANEL
ALT: 12 U/L (ref 0–44)
AST: 20 U/L (ref 15–41)
Albumin: 3.5 g/dL (ref 3.5–5.0)
Alkaline Phosphatase: 53 U/L (ref 38–126)
Anion gap: 5 (ref 5–15)
BUN: 18 mg/dL (ref 8–23)
CO2: 26 mmol/L (ref 22–32)
Calcium: 9 mg/dL (ref 8.9–10.3)
Chloride: 107 mmol/L (ref 98–111)
Creatinine, Ser: 1.31 mg/dL — ABNORMAL HIGH (ref 0.61–1.24)
GFR, Estimated: 60 mL/min — ABNORMAL LOW (ref >60)
Glucose, Bld: 105 mg/dL — ABNORMAL HIGH (ref 70–99)
Potassium: 3.4 mmol/L — ABNORMAL LOW (ref 3.5–5.1)
Sodium: 138 mmol/L (ref 135–145)
Total Bilirubin: 0.4 mg/dL (ref 0.3–1.2)
Total Protein: 6.9 g/dL (ref 6.5–8.1)

## 2022-01-05 LAB — IRON AND TIBC
Iron: 101 ug/dL (ref 45–182)
Saturation Ratios: 36 % (ref 17.9–39.5)
TIBC: 279 ug/dL (ref 250–450)
UIBC: 178 ug/dL

## 2022-01-05 LAB — FERRITIN: Ferritin: 64 ng/mL (ref 24–336)

## 2022-01-06 LAB — KAPPA/LAMBDA LIGHT CHAINS
Kappa free light chain: 160 mg/L — ABNORMAL HIGH (ref 3.3–19.4)
Kappa, lambda light chain ratio: 8.38 — ABNORMAL HIGH (ref 0.26–1.65)
Lambda free light chains: 19.1 mg/L (ref 5.7–26.3)

## 2022-01-10 LAB — MULTIPLE MYELOMA PANEL, SERUM
Albumin SerPl Elph-Mcnc: 3.5 g/dL (ref 2.9–4.4)
Albumin/Glob SerPl: 1.3 (ref 0.7–1.7)
Alpha 1: 0.3 g/dL (ref 0.0–0.4)
Alpha2 Glob SerPl Elph-Mcnc: 0.5 g/dL (ref 0.4–1.0)
B-Globulin SerPl Elph-Mcnc: 0.9 g/dL (ref 0.7–1.3)
Gamma Glob SerPl Elph-Mcnc: 1.2 g/dL (ref 0.4–1.8)
Globulin, Total: 2.9 g/dL (ref 2.2–3.9)
IgA: 141 mg/dL (ref 61–437)
IgG (Immunoglobin G), Serum: 875 mg/dL (ref 603–1613)
IgM (Immunoglobulin M), Srm: 652 mg/dL — ABNORMAL HIGH (ref 20–172)
M Protein SerPl Elph-Mcnc: 0.5 g/dL — ABNORMAL HIGH
Total Protein ELP: 6.4 g/dL (ref 6.0–8.5)

## 2022-01-11 ENCOUNTER — Encounter: Payer: Self-pay | Admitting: Internal Medicine

## 2022-01-11 ENCOUNTER — Inpatient Hospital Stay: Payer: Medicare Other | Attending: Internal Medicine | Admitting: Internal Medicine

## 2022-01-11 DIAGNOSIS — D631 Anemia in chronic kidney disease: Secondary | ICD-10-CM | POA: Insufficient documentation

## 2022-01-11 DIAGNOSIS — R634 Abnormal weight loss: Secondary | ICD-10-CM | POA: Insufficient documentation

## 2022-01-11 DIAGNOSIS — C8309 Small cell B-cell lymphoma, extranodal and solid organ sites: Secondary | ICD-10-CM | POA: Insufficient documentation

## 2022-01-11 DIAGNOSIS — R630 Anorexia: Secondary | ICD-10-CM | POA: Insufficient documentation

## 2022-01-11 DIAGNOSIS — C8300 Small cell B-cell lymphoma, unspecified site: Secondary | ICD-10-CM | POA: Diagnosis not present

## 2022-01-11 DIAGNOSIS — N183 Chronic kidney disease, stage 3 unspecified: Secondary | ICD-10-CM | POA: Diagnosis not present

## 2022-01-11 DIAGNOSIS — F17210 Nicotine dependence, cigarettes, uncomplicated: Secondary | ICD-10-CM | POA: Diagnosis not present

## 2022-01-11 DIAGNOSIS — D472 Monoclonal gammopathy: Secondary | ICD-10-CM | POA: Diagnosis not present

## 2022-01-11 DIAGNOSIS — Z79899 Other long term (current) drug therapy: Secondary | ICD-10-CM | POA: Insufficient documentation

## 2022-01-11 NOTE — Assessment & Plan Note (Addendum)
#   2014-  Malignant lymphoplasmacytic lymphoma-stage IV low-grade-completed maintenance Rituxan 11/16.  CT 1/17 showed no evidence of disease.  No evidence of progression.  Continue surveillance without any additional imaging.  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  Discussed that it is a low-grade lymphoma which unfortunately cannot be cured however needs to be monitored closely.  Patient realizes that he has been dealing with this malignancy for about 10 years now.  STABLE.   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  STABLE.    # Mild anemia-hemoglobin 13.5-;  Likely CKD; recommend PO iron every other day. STABLE.   # screening colonosopy:Colonoscopy- [[facility] 2023]- polyp  # CKD: StageII- III  -GFR -60 STABLE.   .# DISPOSITION: # follow up in 6 months-MD/2 weeks prior-  cbc/cmp/M- protein/ lkappa-lamda light chains; iron studies/ferritin--Dr.B

## 2022-01-11 NOTE — Progress Notes (Signed)
Patient here today for follow up regarding lymphoma. Patient reports decreased appetite, weight loss. Patients weight appears stable from last clinic visit, 147.8 lbs in March and 148.4 lbs today. Patient also reports continued shortness of breath and fatigue.

## 2022-01-11 NOTE — Progress Notes (Signed)
[facility] OFFICE PROGRESS NOTE  Patient Care Team: Pcp, No as PCP - General   SUMMARY OF ONCOLOGIC HISTORY:  . 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 CT [non-contrast]- January 2017- NED  # CKD [stage III creat 1.8]  DIAGNOSIS: lymphoplasmacytic lymphoma/Waldenstroms  STAGE:   IV      ;GOALS: control  CURRENT/MOST RECENT THERAPY: surveillaince   Malignant lymphoma, lymphoplasmacytic (HCC)    INTERVAL HISTORY: Alone.  Ambulating independently.  A very pleasant 66 year old male patient with above history of stage IV lymphoplasmacytic lymphoma/marginal zone lymphoma currently status post maintenance rituximab [last treatment November 2016].  Patient here today for follow up regarding lymphoma. Patient reports decreased appetite, weight loss. Patients weight appears stable from last clinic visit, 147.8 lbs in March and 148.4 lbs today.  Patient states that she has been a lot of social stress given the recent death of his father.  Continues to be physically active/still working.  He is currently retired.  Working part-time.  Denies any new lumps or bumps.  Denies any tingling or numbness.  No nausea or vomiting.  No abdominal pain.   Review of Systems  Constitutional:  Negative for chills, diaphoresis, fever, malaise/fatigue and weight loss.  HENT:  Negative for nosebleeds and sore throat.   Eyes:  Negative for double vision.  Respiratory:  Negative for cough, hemoptysis, sputum production, shortness of breath and wheezing.   Cardiovascular:  Negative for chest pain, palpitations, orthopnea and leg swelling.  Gastrointestinal:  Negative for abdominal pain, blood in stool,  constipation, diarrhea, heartburn, melena, nausea and vomiting.  Genitourinary:  Negative for dysuria, frequency and urgency.  Musculoskeletal:  Negative for back pain and joint pain.  Skin: Negative.  Negative for itching and rash.  Neurological:  Negative for dizziness, tingling, focal weakness, weakness and headaches.  Endo/Heme/Allergies:  Does not bruise/bleed easily.  Psychiatric/Behavioral:  Negative for depression. The patient is not nervous/anxious and does not have insomnia.      PAST MEDICAL HISTORY :  Past Medical History:  Diagnosis Date   Anemia    Arthritis    Chronic kidney disease    GERD (gastroesophageal reflux disease)    Non Hodgkin's lymphoma ([place])     PAST SURGICAL HISTORY :   Past Surgical History:  Procedure Laterality Date   BONE MARROW BIOPSY     Left eye surgery  2012   Left knee surgery  1981   ACL repair    FAMILY HISTORY :   Family History  Problem Relation Age of Onset   Pancreatic cancer Mother 41   Prostate cancer Brother 32   Prostate cancer Father 73    SOCIAL HISTORY:   Social History   Tobacco Use   Smoking status: Every Day    Packs/day: 0.25    Years: 10.00    Total pack years: 2.50    Types: Cigarettes   Smokeless tobacco: Never   Tobacco comments:    "I only smoke in the bar on occasion"  Substance Use Topics   Alcohol use: Yes    Alcohol/week: 0.0 standard drinks of alcohol    Comment: consumes alcohol on the weekend.   Drug use: No    ALLERGIES:  is allergic to no known allergies.  MEDICATIONS:  Current Outpatient Medications  Medication Sig Dispense Refill   cyanocobalamin (VITAMIN B12) 1000 MCG tablet Take 1,000 mcg by mouth daily.     omeprazole (PRILOSEC) 20 MG capsule TAKE ONE CAPSULE BY MOUTH EVERY MORNING TO CONTROL STOMACH ACID.  TAKE 30 MINUTES BEFORE A MEAL     No current facility-administered medications for this visit.    PHYSICAL EXAMINATION: ECOG PERFORMANCE STATUS: 0 - Asymptomatic  BP  125/78   Pulse 69   Temp (!) 97.3 F (36.3 C) (Tympanic)   Resp 18   Wt 148 lb 3.2 oz (67.2 kg)   SpO2 97%   BMI 23.92 kg/m   Filed Weights   01/11/22 1101  Weight: 148 lb 3.2 oz (67.2 kg)    Physical Exam Constitutional:      Comments: He is alone.   HENT:     Head: Normocephalic and atraumatic.     Mouth/Throat:     Pharynx: No oropharyngeal exudate.  Eyes:     Pupils: Pupils are equal, round, and reactive to light.  Cardiovascular:     Rate and Rhythm: Normal rate and regular rhythm.  Pulmonary:     Effort: No respiratory distress.     Breath sounds: No wheezing.  Abdominal:     General: Bowel sounds are normal. There is no distension.     Palpations: Abdomen is soft. There is no mass.     Tenderness: There is no abdominal tenderness. There is no guarding or rebound.  Musculoskeletal:        General: No tenderness. Normal range of motion.     Cervical back: Normal range of motion and neck supple.  Skin:    General: Skin is warm.  Neurological:     Mental Status: He is alert and oriented to person, place, and time.  Psychiatric:        Mood and Affect: Affect normal.     LABORATORY DATA:  I have reviewed the data as listed    Component Value Date/Time   NA 138 01/05/2022 0930   NA 139 11/08/2013 0824   K 3.4 (L) 01/05/2022 0930   K 4.3 11/08/2013 0824   CL 107 01/05/2022 0930   CL 104 11/08/2013 0824   CO2 26 01/05/2022 0930   CO2 27 11/08/2013 0824   GLUCOSE 105 (H) 01/05/2022 0930   GLUCOSE 92 11/08/2013 0824   BUN 18 01/05/2022 0930   BUN 18 11/08/2013 0824   CREATININE 1.31 (H) 01/05/2022 0930   CREATININE 1.85 (H) 04/25/2014 0840   CALCIUM 9.0 01/05/2022 0930   CALCIUM 8.8 11/08/2013 0824   PROT 6.9 01/05/2022 0930   PROT 6.6 02/28/2014 0902   ALBUMIN 3.5 01/05/2022 0930   ALBUMIN 3.5 02/28/2014 0902   AST 20 01/05/2022 0930   AST 14 (L) 02/28/2014 0902   ALT 12 01/05/2022 0930   ALT 19 02/28/2014 0902   ALKPHOS 53 01/05/2022 0930    ALKPHOS 53 02/28/2014 0902   BILITOT 0.4 01/05/2022 0930   BILITOT 0.3 02/28/2014 0902   GFRNONAA 60 (L) 01/05/2022 0930   GFRNONAA 40 (L) 04/25/2014 0840   GFRNONAA 46 (L) 11/08/2013 0824   GFRAA >60 11/08/2019 0915   GFRAA 48 (L) 04/25/2014 0840   GFRAA 53 (L) 11/08/2013 0824    No results found for: "SPEP", "UPEP"  Lab Results  Component Value Date   WBC 6.2 01/05/2022   NEUTROABS 2.9 01/05/2022   HGB 13.0 01/05/2022   HCT 39.7 01/05/2022  MCV 90.8 01/05/2022   PLT 289 01/05/2022      Chemistry      Component Value Date/Time   NA 138 01/05/2022 0930   NA 139 11/08/2013 0824   K 3.4 (L) 01/05/2022 0930   K 4.3 11/08/2013 0824   CL 107 01/05/2022 0930   CL 104 11/08/2013 0824   CO2 26 01/05/2022 0930   CO2 27 11/08/2013 0824   BUN 18 01/05/2022 0930   BUN 18 11/08/2013 0824   CREATININE 1.31 (H) 01/05/2022 0930   CREATININE 1.85 (H) 04/25/2014 0840      Component Value Date/Time   CALCIUM 9.0 01/05/2022 0930   CALCIUM 8.8 11/08/2013 0824   ALKPHOS 53 01/05/2022 0930   ALKPHOS 53 02/28/2014 0902   AST 20 01/05/2022 0930   AST 14 (L) 02/28/2014 0902   ALT 12 01/05/2022 0930   ALT 19 02/28/2014 0902   BILITOT 0.4 01/05/2022 0930   BILITOT 0.3 02/28/2014 0902       ASSESSMENT & PLAN:   Malignant lymphoma, lymphoplasmacytic ([place]) # 2014-  Malignant lymphoplasmacytic lymphoma-stage IV low-grade-completed maintenance Rituxan 11/16.  CT 1/17 showed no evidence of disease.  No evidence of progression.  Continue surveillance without any additional imaging.  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  Discussed that it is a low-grade lymphoma which unfortunately cannot be cured however needs to be monitored closely.  Patient realizes that he has been dealing with this malignancy for about 10 years now.  STABLE.   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  STABLE.     # Mild anemia-hemoglobin 13.5-;  Likely CKD; recommend PO iron every other day. STABLE.   # screening colonosopy:Colonoscopy- [[facility]]- polyp  # CKD: StageII- III  -GFR -60 STABLE.   .# DISPOSITION: # follow up in 6 months-MD/2 weeks prior-  cbc/cmp/M- protein/ lkappa-lamda light chains; iron studies/ferritin--Dr.B     Cammie Sickle, MD 01/11/2022 12:30 PM

## 2022-07-13 ENCOUNTER — Inpatient Hospital Stay: Payer: HMO | Attending: Internal Medicine

## 2022-07-13 DIAGNOSIS — N183 Chronic kidney disease, stage 3 unspecified: Secondary | ICD-10-CM | POA: Insufficient documentation

## 2022-07-13 DIAGNOSIS — Z800 Family history of malignant neoplasm of digestive organs: Secondary | ICD-10-CM | POA: Diagnosis not present

## 2022-07-13 DIAGNOSIS — F17210 Nicotine dependence, cigarettes, uncomplicated: Secondary | ICD-10-CM | POA: Insufficient documentation

## 2022-07-13 DIAGNOSIS — C8309 Small cell B-cell lymphoma, extranodal and solid organ sites: Secondary | ICD-10-CM | POA: Insufficient documentation

## 2022-07-13 DIAGNOSIS — Z79899 Other long term (current) drug therapy: Secondary | ICD-10-CM | POA: Insufficient documentation

## 2022-07-13 DIAGNOSIS — D649 Anemia, unspecified: Secondary | ICD-10-CM | POA: Diagnosis not present

## 2022-07-13 DIAGNOSIS — Z8042 Family history of malignant neoplasm of prostate: Secondary | ICD-10-CM | POA: Diagnosis not present

## 2022-07-13 DIAGNOSIS — D472 Monoclonal gammopathy: Secondary | ICD-10-CM | POA: Insufficient documentation

## 2022-07-13 DIAGNOSIS — K219 Gastro-esophageal reflux disease without esophagitis: Secondary | ICD-10-CM | POA: Diagnosis not present

## 2022-07-13 DIAGNOSIS — C8300 Small cell B-cell lymphoma, unspecified site: Secondary | ICD-10-CM

## 2022-07-13 LAB — CBC WITH DIFFERENTIAL/PLATELET
Abs Immature Granulocytes: 0.03 10*3/uL (ref 0.00–0.07)
Basophils Absolute: 0.1 10*3/uL (ref 0.0–0.1)
Basophils Relative: 1 %
Eosinophils Absolute: 0.1 10*3/uL (ref 0.0–0.5)
Eosinophils Relative: 1 %
HCT: 38.6 % — ABNORMAL LOW (ref 39.0–52.0)
Hemoglobin: 12.8 g/dL — ABNORMAL LOW (ref 13.0–17.0)
Immature Granulocytes: 1 %
Lymphocytes Relative: 32 %
Lymphs Abs: 2 10*3/uL (ref 0.7–4.0)
MCH: 30.1 pg (ref 26.0–34.0)
MCHC: 33.2 g/dL (ref 30.0–36.0)
MCV: 90.8 fL (ref 80.0–100.0)
Monocytes Absolute: 0.9 10*3/uL (ref 0.1–1.0)
Monocytes Relative: 14 %
Neutro Abs: 3.2 10*3/uL (ref 1.7–7.7)
Neutrophils Relative %: 51 %
Platelets: 268 10*3/uL (ref 150–400)
RBC: 4.25 MIL/uL (ref 4.22–5.81)
RDW: 15.1 % (ref 11.5–15.5)
WBC: 6.3 10*3/uL (ref 4.0–10.5)
nRBC: 0 % (ref 0.0–0.2)

## 2022-07-13 LAB — COMPREHENSIVE METABOLIC PANEL
ALT: 13 U/L (ref 0–44)
AST: 22 U/L (ref 15–41)
Albumin: 3.8 g/dL (ref 3.5–5.0)
Alkaline Phosphatase: 53 U/L (ref 38–126)
Anion gap: 7 (ref 5–15)
BUN: 17 mg/dL (ref 8–23)
CO2: 25 mmol/L (ref 22–32)
Calcium: 8.9 mg/dL (ref 8.9–10.3)
Chloride: 105 mmol/L (ref 98–111)
Creatinine, Ser: 1.47 mg/dL — ABNORMAL HIGH (ref 0.61–1.24)
GFR, Estimated: 52 mL/min — ABNORMAL LOW (ref >60)
Glucose, Bld: 92 mg/dL (ref 70–99)
Potassium: 3.4 mmol/L — ABNORMAL LOW (ref 3.5–5.1)
Sodium: 137 mmol/L (ref 135–145)
Total Bilirubin: 0.3 mg/dL (ref 0.3–1.2)
Total Protein: 7.4 g/dL (ref 6.5–8.1)

## 2022-07-13 LAB — IRON AND TIBC
Iron: 91 ug/dL (ref 45–182)
Saturation Ratios: 32 % (ref 17.9–39.5)
TIBC: 284 ug/dL (ref 250–450)
UIBC: 193 ug/dL

## 2022-07-13 LAB — FERRITIN: Ferritin: 57 ng/mL (ref 24–336)

## 2022-07-14 LAB — KAPPA/LAMBDA LIGHT CHAINS
Kappa free light chain: 134.4 mg/L — ABNORMAL HIGH (ref 3.3–19.4)
Kappa, lambda light chain ratio: 6.93 — ABNORMAL HIGH (ref 0.26–1.65)
Lambda free light chains: 19.4 mg/L (ref 5.7–26.3)

## 2022-07-18 LAB — MULTIPLE MYELOMA PANEL, SERUM
Albumin SerPl Elph-Mcnc: 3.7 g/dL (ref 2.9–4.4)
Albumin/Glob SerPl: 1.3 (ref 0.7–1.7)
Alpha 1: 0.3 g/dL (ref 0.0–0.4)
Alpha2 Glob SerPl Elph-Mcnc: 0.6 g/dL (ref 0.4–1.0)
B-Globulin SerPl Elph-Mcnc: 0.8 g/dL (ref 0.7–1.3)
Gamma Glob SerPl Elph-Mcnc: 1.3 g/dL (ref 0.4–1.8)
Globulin, Total: 3 g/dL (ref 2.2–3.9)
IgA: 162 mg/dL (ref 61–437)
IgG (Immunoglobin G), Serum: 905 mg/dL (ref 603–1613)
IgM (Immunoglobulin M), Srm: 723 mg/dL — ABNORMAL HIGH (ref 20–172)
M Protein SerPl Elph-Mcnc: 0.6 g/dL — ABNORMAL HIGH
Total Protein ELP: 6.7 g/dL (ref 6.0–8.5)

## 2022-07-21 ENCOUNTER — Encounter: Payer: Self-pay | Admitting: Internal Medicine

## 2022-07-21 ENCOUNTER — Inpatient Hospital Stay (HOSPITAL_BASED_OUTPATIENT_CLINIC_OR_DEPARTMENT_OTHER): Payer: HMO | Admitting: Internal Medicine

## 2022-07-21 VITALS — BP 114/95 | HR 70 | Temp 97.2°F | Wt 144.7 lb

## 2022-07-21 DIAGNOSIS — C8300 Small cell B-cell lymphoma, unspecified site: Secondary | ICD-10-CM | POA: Diagnosis not present

## 2022-07-21 DIAGNOSIS — C8309 Small cell B-cell lymphoma, extranodal and solid organ sites: Secondary | ICD-10-CM | POA: Diagnosis not present

## 2022-07-21 NOTE — Assessment & Plan Note (Addendum)
#   2014-  Malignant lymphoplasmacytic lymphoma-stage IV low-grade-completed maintenance Rituxan 11/16.  CT 1/17 showed no evidence of disease.  No evidence of progression.    # Continue surveillance without any additional imaging.  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  Discussed that it is a low-grade lymphoma which unfortunately cannot be cured however needs to be monitored closely.  Stable.   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  This pill is unlikely to cause stomach upset or cause constipation.   # screening colonosopy:Colonoscopy- [[facility]]- polyp  # CKD: StageII- III  -GFR -52- stable.   .# DISPOSITION: # follow up in 6 months-MD/2 weeks prior-  cbc/cmp/M- protein/ lkappa-lamda light chains; iron studies/ferritin--Dr.B

## 2022-07-21 NOTE — Progress Notes (Signed)
[facility] OFFICE PROGRESS NOTE  Patient Care Team: Pcp, No as PCP - General   SUMMARY OF ONCOLOGIC HISTORY:  . 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 CT [non-contrast]- January 2017- NED  # CKD [stage III creat 1.8]  DIAGNOSIS: lymphoplasmacytic lymphoma/Waldenstroms  STAGE:   IV      ;GOALS: control  CURRENT/MOST RECENT THERAPY: surveillaince   Malignant lymphoma, lymphoplasmacytic    INTERVAL HISTORY: Alone.  Ambulating independently.  A very pleasant 67 year old male patient with above history of stage IV lymphoplasmacytic lymphoma/marginal zone lymphoma currently status post maintenance rituximab [last treatment November 2016].  Patient denies any concerns at this time.  Patient reports decreased appetite, weight loss. Patients weight appears lost 4 pounds in last 6 months.  Continues to be physically active/still working.  He is currently retired.  Working part-time.  Denies any new lumps or bumps.  Denies any tingling or numbness.  No nausea or vomiting.  No abdominal pain.   Review of Systems  Constitutional:  Negative for chills, diaphoresis, fever, malaise/fatigue and weight loss.  HENT:  Negative for nosebleeds and sore throat.   Eyes:  Negative for double vision.  Respiratory:  Negative for cough, hemoptysis, sputum production, shortness of breath and wheezing.   Cardiovascular:  Negative for chest pain, palpitations, orthopnea and leg swelling.  Gastrointestinal:  Negative for abdominal pain, blood in stool, constipation, diarrhea, heartburn, melena, nausea and vomiting.  Genitourinary:  Negative for dysuria, frequency and urgency.  Musculoskeletal:  Negative  for back pain and joint pain.  Skin: Negative.  Negative for itching and rash.  Neurological:  Negative for dizziness, tingling, focal weakness, weakness and headaches.  Endo/Heme/Allergies:  Does not bruise/bleed easily.  Psychiatric/Behavioral:  Negative for depression. The patient is not nervous/anxious and does not have insomnia.      PAST MEDICAL HISTORY :  Past Medical History:  Diagnosis Date   Anemia    Arthritis    Chronic kidney disease    GERD (gastroesophageal reflux disease)    Non Hodgkin's lymphoma     PAST SURGICAL HISTORY :   Past Surgical History:  Procedure Laterality Date   BONE MARROW BIOPSY     Left eye surgery  2012   Left knee surgery  1981   ACL repair    FAMILY HISTORY :   Family History  Problem Relation Age of Onset   Pancreatic cancer Mother [phone]   Prostate cancer Brother 2150   Prostate cancer Father 1350    SOCIAL HISTORY:   Social History   Tobacco Use   Smoking status: Every Day    Packs/day: 0.25    Years: 10.00    Additional pack years: 0.00    Total pack years: 2.50    Types: Cigarettes   Smokeless tobacco: Never   Tobacco comments:    "I only smoke in the bar on occasion"  Substance Use Topics   Alcohol use: Yes    Alcohol/week: 0.0 standard drinks of alcohol    Comment: consumes alcohol on the weekend.   Drug use: No    ALLERGIES:  is allergic to no known allergies.  MEDICATIONS:  Current Outpatient Medications  Medication Sig Dispense Refill   cyanocobalamin (VITAMIN B12) 1000 MCG tablet Take  1,000 mcg by mouth daily.     omeprazole (PRILOSEC) 20 MG capsule TAKE ONE CAPSULE BY MOUTH EVERY MORNING TO CONTROL STOMACH ACID.  TAKE 30 MINUTES BEFORE A MEAL     No current facility-administered medications for this visit.    PHYSICAL EXAMINATION: ECOG PERFORMANCE STATUS: 0 - Asymptomatic  BP (!) 114/95 (BP Location: Left Arm, Patient Position: Sitting)   Pulse 70   Temp (!) 97.2 F (36.2 C) (Tympanic)   Wt 144 lb 11.2  oz (65.6 kg)   SpO2 99%   BMI 23.36 kg/m   Filed Weights   07/21/22 0923  Weight: 144 lb 11.2 oz (65.6 kg)    Physical Exam Constitutional:      Comments: He is alone.   HENT:     Head: Normocephalic and atraumatic.     Mouth/Throat:     Pharynx: No oropharyngeal exudate.  Eyes:     Pupils: Pupils are equal, round, and reactive to light.  Cardiovascular:     Rate and Rhythm: Normal rate and regular rhythm.  Pulmonary:     Effort: No respiratory distress.     Breath sounds: No wheezing.  Abdominal:     General: Bowel sounds are normal. There is no distension.     Palpations: Abdomen is soft. There is no mass.     Tenderness: There is no abdominal tenderness. There is no guarding or rebound.  Musculoskeletal:        General: No tenderness. Normal range of motion.     Cervical back: Normal range of motion and neck supple.  Skin:    General: Skin is warm.  Neurological:     Mental Status: He is alert and oriented to person, place, and time.  Psychiatric:        Mood and Affect: Affect normal.     LABORATORY DATA:  I have reviewed the data as listed    Component Value Date/Time   NA 137 07/13/2022 0946   NA 139 11/08/2013 0824   K 3.4 (L) 07/13/2022 0946   K 4.3 11/08/2013 0824   CL 105 07/13/2022 0946   CL 104 11/08/2013 0824   CO2 25 07/13/2022 0946   CO2 27 11/08/2013 0824   GLUCOSE 92 07/13/2022 0946   GLUCOSE 92 11/08/2013 0824   BUN 17 07/13/2022 0946   BUN 18 11/08/2013 0824   CREATININE 1.47 (H) 07/13/2022 0946   CREATININE 1.85 (H) 04/25/2014 0840   CALCIUM 8.9 07/13/2022 0946   CALCIUM 8.8 11/08/2013 0824   PROT 7.4 07/13/2022 0946   PROT 6.6 02/28/2014 0902   ALBUMIN 3.8 07/13/2022 0946   ALBUMIN 3.5 02/28/2014 0902   AST 22 07/13/2022 0946   AST 14 (L) 02/28/2014 0902   ALT 13 07/13/2022 0946   ALT 19 02/28/2014 0902   ALKPHOS 53 07/13/2022 0946   ALKPHOS 53 02/28/2014 0902   BILITOT 0.3 07/13/2022 0946   BILITOT 0.3 02/28/2014 0902    GFRNONAA 52 (L) 07/13/2022 0946   GFRNONAA 40 (L) 04/25/2014 0840   GFRNONAA 46 (L) 11/08/2013 0824   GFRAA >60 11/08/2019 0915   GFRAA 48 (L) 04/25/2014 0840   GFRAA 53 (L) 11/08/2013 0824    No results found for: "SPEP", "UPEP"  Lab Results  Component Value Date   WBC 6.3 07/13/2022   NEUTROABS 3.2 07/13/2022   HGB 12.8 (L) 07/13/2022   HCT 38.6 (L) 07/13/2022   MCV 90.8 07/13/2022   PLT 268 07/13/2022      Chemistry  Component Value Date/Time   NA 137 07/13/2022 0946   NA 139 11/08/2013 0824   K 3.4 (L) 07/13/2022 0946   K 4.3 11/08/2013 0824   CL 105 07/13/2022 0946   CL 104 11/08/2013 0824   CO2 25 07/13/2022 0946   CO2 27 11/08/2013 0824   BUN 17 07/13/2022 0946   BUN 18 11/08/2013 0824   CREATININE 1.47 (H) 07/13/2022 0946   CREATININE 1.85 (H) 04/25/2014 0840      Component Value Date/Time   CALCIUM 8.9 07/13/2022 0946   CALCIUM 8.8 11/08/2013 0824   ALKPHOS 53 07/13/2022 0946   ALKPHOS 53 02/28/2014 0902   AST 22 07/13/2022 0946   AST 14 (L) 02/28/2014 0902   ALT 13 07/13/2022 0946   ALT 19 02/28/2014 0902   BILITOT 0.3 07/13/2022 0946   BILITOT 0.3 02/28/2014 0902       ASSESSMENT & PLAN:   Malignant lymphoma, lymphoplasmacytic (HCC) # 2014-  Malignant lymphoplasmacytic lymphoma-stage IV low-grade-completed maintenance Rituxan 11/16.  CT 1/17 showed no evidence of disease.  No evidence of progression.    # Continue surveillance without any additional imaging.  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  Discussed that it is a low-grade lymphoma which unfortunately cannot be cured however needs to be monitored closely.  Stable.   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  This pill is unlikely to cause stomach  upset or cause constipation.   # screening colonosopy:Colonoscopy- [facility]]- polyp  # CKD: StageII- III  -GFR -52- stable.   .# DISPOSITION: # follow up in 6 months-MD/2 weeks prior-  cbc/cmp/M- protein/ lkappa-lamda light chains; iron studies/ferritin--Dr.B     Earna Coder, MD 07/21/2022 10:09 AM

## 2022-07-21 NOTE — Progress Notes (Signed)
Patient here for 6 month follow up. Patient denies any concerns at this time.

## 2022-07-21 NOTE — Patient Instructions (Signed)
#  Recommend gentle iron [iron biglycinate; 28 mg ] 1 pill a day.  This pill is unlikely to cause stomach upset or cause constipation.  

## 2022-12-06 ENCOUNTER — Other Ambulatory Visit: Payer: HMO

## 2023-01-06 ENCOUNTER — Inpatient Hospital Stay: Payer: HMO | Attending: Internal Medicine

## 2023-01-06 DIAGNOSIS — Z79899 Other long term (current) drug therapy: Secondary | ICD-10-CM | POA: Insufficient documentation

## 2023-01-06 DIAGNOSIS — Z800 Family history of malignant neoplasm of digestive organs: Secondary | ICD-10-CM | POA: Insufficient documentation

## 2023-01-06 DIAGNOSIS — F17210 Nicotine dependence, cigarettes, uncomplicated: Secondary | ICD-10-CM | POA: Insufficient documentation

## 2023-01-06 DIAGNOSIS — D649 Anemia, unspecified: Secondary | ICD-10-CM | POA: Diagnosis not present

## 2023-01-06 DIAGNOSIS — Z8042 Family history of malignant neoplasm of prostate: Secondary | ICD-10-CM | POA: Diagnosis not present

## 2023-01-06 DIAGNOSIS — C8309 Small cell B-cell lymphoma, extranodal and solid organ sites: Secondary | ICD-10-CM | POA: Insufficient documentation

## 2023-01-06 DIAGNOSIS — D472 Monoclonal gammopathy: Secondary | ICD-10-CM | POA: Diagnosis not present

## 2023-01-06 DIAGNOSIS — K219 Gastro-esophageal reflux disease without esophagitis: Secondary | ICD-10-CM | POA: Insufficient documentation

## 2023-01-06 DIAGNOSIS — N183 Chronic kidney disease, stage 3 unspecified: Secondary | ICD-10-CM | POA: Insufficient documentation

## 2023-01-06 DIAGNOSIS — C8300 Small cell B-cell lymphoma, unspecified site: Secondary | ICD-10-CM

## 2023-01-06 LAB — CBC WITH DIFFERENTIAL (CANCER CENTER ONLY)
Abs Immature Granulocytes: 0.02 10*3/uL (ref 0.00–0.07)
Basophils Absolute: 0.1 10*3/uL (ref 0.0–0.1)
Basophils Relative: 1 %
Eosinophils Absolute: 0.1 10*3/uL (ref 0.0–0.5)
Eosinophils Relative: 2 %
HCT: 37.1 % — ABNORMAL LOW (ref 39.0–52.0)
Hemoglobin: 12.3 g/dL — ABNORMAL LOW (ref 13.0–17.0)
Immature Granulocytes: 0 %
Lymphocytes Relative: 30 %
Lymphs Abs: 2.3 10*3/uL (ref 0.7–4.0)
MCH: 30.2 pg (ref 26.0–34.0)
MCHC: 33.2 g/dL (ref 30.0–36.0)
MCV: 91.2 fL (ref 80.0–100.0)
Monocytes Absolute: 0.8 10*3/uL (ref 0.1–1.0)
Monocytes Relative: 10 %
Neutro Abs: 4.4 10*3/uL (ref 1.7–7.7)
Neutrophils Relative %: 57 %
Platelet Count: 279 10*3/uL (ref 150–400)
RBC: 4.07 MIL/uL — ABNORMAL LOW (ref 4.22–5.81)
RDW: 15 % (ref 11.5–15.5)
WBC Count: 7.6 10*3/uL (ref 4.0–10.5)
nRBC: 0 % (ref 0.0–0.2)

## 2023-01-06 LAB — IRON AND TIBC
Iron: 83 ug/dL (ref 45–182)
Saturation Ratios: 28 % (ref 17.9–39.5)
TIBC: 293 ug/dL (ref 250–450)
UIBC: 210 ug/dL

## 2023-01-06 LAB — CMP (CANCER CENTER ONLY)
ALT: 14 U/L (ref 0–44)
AST: 21 U/L (ref 15–41)
Albumin: 3.9 g/dL (ref 3.5–5.0)
Alkaline Phosphatase: 51 U/L (ref 38–126)
Anion gap: 8 (ref 5–15)
BUN: 17 mg/dL (ref 8–23)
CO2: 22 mmol/L (ref 22–32)
Calcium: 9.1 mg/dL (ref 8.9–10.3)
Chloride: 106 mmol/L (ref 98–111)
Creatinine: 1.51 mg/dL — ABNORMAL HIGH (ref 0.61–1.24)
GFR, Estimated: 50 mL/min — ABNORMAL LOW (ref >60)
Glucose, Bld: 121 mg/dL — ABNORMAL HIGH (ref 70–99)
Potassium: 3.4 mmol/L — ABNORMAL LOW (ref 3.5–5.1)
Sodium: 136 mmol/L (ref 135–145)
Total Bilirubin: 0.6 mg/dL (ref 0.3–1.2)
Total Protein: 7.4 g/dL (ref 6.5–8.1)

## 2023-01-06 LAB — FERRITIN: Ferritin: 45 ng/mL (ref 24–336)

## 2023-01-09 LAB — KAPPA/LAMBDA LIGHT CHAINS
Kappa free light chain: 155.3 mg/L — ABNORMAL HIGH (ref 3.3–19.4)
Kappa, lambda light chain ratio: 7.92 — ABNORMAL HIGH (ref 0.26–1.65)
Lambda free light chains: 19.6 mg/L (ref 5.7–26.3)

## 2023-01-10 LAB — MULTIPLE MYELOMA PANEL, SERUM
Albumin SerPl Elph-Mcnc: 3.7 g/dL (ref 2.9–4.4)
Albumin/Glob SerPl: 1.2 (ref 0.7–1.7)
Alpha 1: 0.2 g/dL (ref 0.0–0.4)
Alpha2 Glob SerPl Elph-Mcnc: 0.6 g/dL (ref 0.4–1.0)
B-Globulin SerPl Elph-Mcnc: 0.9 g/dL (ref 0.7–1.3)
Gamma Glob SerPl Elph-Mcnc: 1.5 g/dL (ref 0.4–1.8)
Globulin, Total: 3.2 g/dL (ref 2.2–3.9)
IgA: 162 mg/dL (ref 61–437)
IgG (Immunoglobin G), Serum: 1001 mg/dL (ref 603–1613)
IgM (Immunoglobulin M), Srm: 832 mg/dL — ABNORMAL HIGH (ref 20–172)
M Protein SerPl Elph-Mcnc: 0.7 g/dL — ABNORMAL HIGH
Total Protein ELP: 6.9 g/dL (ref 6.0–8.5)

## 2023-01-17 ENCOUNTER — Other Ambulatory Visit: Payer: HMO

## 2023-01-20 ENCOUNTER — Inpatient Hospital Stay: Payer: HMO | Admitting: Internal Medicine

## 2023-02-22 ENCOUNTER — Encounter: Payer: Self-pay | Admitting: Internal Medicine

## 2023-02-22 ENCOUNTER — Inpatient Hospital Stay: Payer: HMO | Attending: Internal Medicine | Admitting: Internal Medicine

## 2023-02-22 VITALS — BP 118/88 | HR 71 | Temp 94.9°F | Ht 66.0 in | Wt 150.2 lb

## 2023-02-22 DIAGNOSIS — C8309 Small cell B-cell lymphoma, extranodal and solid organ sites: Secondary | ICD-10-CM | POA: Diagnosis not present

## 2023-02-22 DIAGNOSIS — D649 Anemia, unspecified: Secondary | ICD-10-CM | POA: Insufficient documentation

## 2023-02-22 DIAGNOSIS — Z8042 Family history of malignant neoplasm of prostate: Secondary | ICD-10-CM | POA: Diagnosis not present

## 2023-02-22 DIAGNOSIS — C8300 Small cell B-cell lymphoma, unspecified site: Secondary | ICD-10-CM | POA: Diagnosis not present

## 2023-02-22 DIAGNOSIS — Z79899 Other long term (current) drug therapy: Secondary | ICD-10-CM | POA: Diagnosis not present

## 2023-02-22 DIAGNOSIS — N183 Chronic kidney disease, stage 3 unspecified: Secondary | ICD-10-CM | POA: Insufficient documentation

## 2023-02-22 DIAGNOSIS — Z800 Family history of malignant neoplasm of digestive organs: Secondary | ICD-10-CM | POA: Diagnosis not present

## 2023-02-22 DIAGNOSIS — F17210 Nicotine dependence, cigarettes, uncomplicated: Secondary | ICD-10-CM | POA: Diagnosis not present

## 2023-02-22 NOTE — Progress Notes (Signed)
[facility] OFFICE PROGRESS NOTE  Patient Care Team: Pcp, No as PCP - General Earna Coder, MD as Consulting Physician (Oncology)   SUMMARY OF ONCOLOGIC HISTORY:  . 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 CT [non-contrast]- January 2017- NED  # CKD [stage III creat 1.8]  DIAGNOSIS: lymphoplasmacytic lymphoma/Waldenstroms  STAGE:   IV      ;GOALS: control  CURRENT/MOST RECENT THERAPY: surveillaince   Malignant lymphoma, lymphoplasmacytic (HCC)    INTERVAL HISTORY: Alone.  Ambulating independently.  A very pleasant 67 -year-old male patient with above history of stage IV lymphoplasmacytic lymphoma/marginal zone lymphoma currently status post maintenance rituximab [last treatment November 2016].  Patient denies any concerns at this time.  Continues to be physically active/still working.  He is currently working part retired.   Denies any new lumps or bumps.  Denies any tingling or numbness.  No nausea or vomiting.  No abdominal pain.   Review of Systems  Constitutional:  Negative for chills, diaphoresis, fever, malaise/fatigue and weight loss.  HENT:  Negative for nosebleeds and sore throat.   Eyes:  Negative for double vision.  Respiratory:  Negative for cough, hemoptysis, sputum production, shortness of breath and wheezing.   Cardiovascular:  Negative for chest pain, palpitations, orthopnea and leg swelling.  Gastrointestinal:  Negative for abdominal pain, blood in stool, constipation, diarrhea, heartburn, melena, nausea and vomiting.  Genitourinary:  Negative for dysuria, frequency and urgency.  Musculoskeletal:  Negative for back pain and joint pain.  Skin: Negative.   Negative for itching and rash.  Neurological:  Negative for dizziness, tingling, focal weakness, weakness and headaches.  Endo/Heme/Allergies:  Does not bruise/bleed easily.  Psychiatric/Behavioral:  Negative for depression. The patient is not nervous/anxious and does not have insomnia.      PAST MEDICAL HISTORY :  Past Medical History:  Diagnosis Date   Anemia    Arthritis    Chronic kidney disease    GERD (gastroesophageal reflux disease)    Non Hodgkin's lymphoma (HCC)     PAST SURGICAL HISTORY :   Past Surgical History:  Procedure Laterality Date   BONE MARROW BIOPSY     Left eye surgery  2012   Left knee surgery  1981   ACL repair    FAMILY HISTORY :   Family History  Problem Relation Age of Onset   Pancreatic cancer Mother 57   Prostate cancer Brother 34   Prostate cancer Father 59    SOCIAL HISTORY:   Social History   Tobacco Use   Smoking status: Every Day    Current packs/day: 0.25    Average packs/day: 0.3 packs/day for 10.0 years (2.5 ttl pk-yrs)    Types: Cigarettes   Smokeless tobacco: Never   Tobacco comments:    "I only smoke in the bar on occasion"  Substance Use Topics   Alcohol use: Yes    Alcohol/week: 0.0 standard drinks of alcohol    Comment: consumes alcohol on the weekend.   Drug use: No    ALLERGIES:  is allergic to no known allergies.  MEDICATIONS:  Current Outpatient Medications  Medication Sig Dispense Refill   cyanocobalamin (VITAMIN B12) 1000 MCG tablet Take 1,000 mcg by mouth daily.     omeprazole (PRILOSEC)  20 MG capsule TAKE ONE CAPSULE BY MOUTH EVERY MORNING TO CONTROL STOMACH ACID.  TAKE 30 MINUTES BEFORE A MEAL     No current facility-administered medications for this visit.    PHYSICAL EXAMINATION: ECOG PERFORMANCE STATUS: 0 - Asymptomatic  BP 118/88 (BP Location: Left Arm, Patient Position: Sitting, Cuff Size: Normal)   Pulse 71   Temp (!) 94.9 F (34.9 C) (Tympanic)   Ht 5\' 6"  (1.676 m)   Wt 150 lb 3.2 oz  (68.1 kg)   SpO2 99%   BMI 24.24 kg/m   Filed Weights   02/22/23 1014  Weight: 150 lb 3.2 oz (68.1 kg)    Physical Exam Constitutional:      Comments: He is alone.   HENT:     Head: Normocephalic and atraumatic.     Mouth/Throat:     Pharynx: No oropharyngeal exudate.  Eyes:     Pupils: Pupils are equal, round, and reactive to light.  Cardiovascular:     Rate and Rhythm: Normal rate and regular rhythm.  Pulmonary:     Effort: No respiratory distress.     Breath sounds: No wheezing.  Abdominal:     General: Bowel sounds are normal. There is no distension.     Palpations: Abdomen is soft. There is no mass.     Tenderness: There is no abdominal tenderness. There is no guarding or rebound.  Musculoskeletal:        General: No tenderness. Normal range of motion.     Cervical back: Normal range of motion and neck supple.  Skin:    General: Skin is warm.  Neurological:     Mental Status: He is alert and oriented to person, place, and time.  Psychiatric:        Mood and Affect: Affect normal.     LABORATORY DATA:  I have reviewed the data as listed    Component Value Date/Time   NA 136 01/06/2023 0928   NA 139 11/08/2013 0824   K 3.4 (L) 01/06/2023 0928   K 4.3 11/08/2013 0824   CL 106 01/06/2023 0928   CL 104 11/08/2013 0824   CO2 22 01/06/2023 0928   CO2 27 11/08/2013 0824   GLUCOSE 121 (H) 01/06/2023 0928   GLUCOSE 92 11/08/2013 0824   BUN 17 01/06/2023 0928   BUN 18 11/08/2013 0824   CREATININE 1.51 (H) 01/06/2023 0928   CREATININE 1.85 (H) 04/25/2014 0840   CALCIUM 9.1 01/06/2023 0928   CALCIUM 8.8 11/08/2013 0824   PROT 7.4 01/06/2023 0928   PROT 6.6 02/28/2014 0902   ALBUMIN 3.9 01/06/2023 0928   ALBUMIN 3.5 02/28/2014 0902   AST 21 01/06/2023 0928   ALT 14 01/06/2023 0928   ALT 19 02/28/2014 0902   ALKPHOS 51 01/06/2023 0928   ALKPHOS 53 02/28/2014 0902   BILITOT 0.6 01/06/2023 0928   GFRNONAA 50 (L) 01/06/2023 0928   GFRNONAA 40 (L) 04/25/2014  0840   GFRNONAA 46 (L) 11/08/2013 0824   GFRAA >60 11/08/2019 0915   GFRAA 48 (L) 04/25/2014 0840   GFRAA 53 (L) 11/08/2013 0824    No results found for: "SPEP", "UPEP"  Lab Results  Component Value Date   WBC 7.6 01/06/2023   NEUTROABS 4.4 01/06/2023   HGB 12.3 (L) 01/06/2023   HCT 37.1 (L) 01/06/2023   MCV 91.2 01/06/2023   PLT 279 01/06/2023      Chemistry      Component Value Date/Time   NA 136 01/06/2023 0928  NA 139 11/08/2013 0824   K 3.4 (L) 01/06/2023 0928   K 4.3 11/08/2013 0824   CL 106 01/06/2023 0928   CL 104 11/08/2013 0824   CO2 22 01/06/2023 0928   CO2 27 11/08/2013 0824   BUN 17 01/06/2023 0928   BUN 18 11/08/2013 0824   CREATININE 1.51 (H) 01/06/2023 0928   CREATININE 1.85 (H) 04/25/2014 0840      Component Value Date/Time   CALCIUM 9.1 01/06/2023 0928   CALCIUM 8.8 11/08/2013 0824   ALKPHOS 51 01/06/2023 0928   ALKPHOS 53 02/28/2014 0902   AST 21 01/06/2023 0928   ALT 14 01/06/2023 0928   ALT 19 02/28/2014 0902   BILITOT 0.6 01/06/2023 0928       ASSESSMENT & PLAN:   Malignant lymphoma, lymphoplasmacytic (HCC) # 2014-  Malignant lymphoplasmacytic lymphoma-stage IV low-grade-completed maintenance Rituxan 11/16.  CT 1/17 showed no evidence of disease.  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   # Continue surveillance without any additional imaging.  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  Discussed that it is a low-grade lymphoma which unfortunately cannot be cured however needs to be monitored closely- stable.   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  This pill is unlikely to cause stomach upset or cause constipation.   # screening colonosopy:Colonoscopy- [[facility]]- polyp  # CKD: StageII- III  -GFR -52- stable.     .# DISPOSITION: # follow up in 6 months-MD/2 weeks prior-  cbc/cmp/M- protein/ lkappa-lamda light chains; iron studies/ferritin--Dr.B     Earna Coder, MD 02/22/2023 10:55 AM

## 2023-02-22 NOTE — Progress Notes (Signed)
No questions / concerns.

## 2023-02-22 NOTE — Assessment & Plan Note (Signed)
#   2014-  Malignant lymphoplasmacytic lymphoma-stage IV low-grade-completed maintenance Rituxan 11/16.  CT 1/17 showed no evidence of disease.  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   # Continue surveillance without any additional imaging.  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  Discussed that it is a low-grade lymphoma which unfortunately cannot be cured however needs to be monitored closely- stable.   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  This pill is unlikely to cause stomach upset or cause constipation.   # screening colonosopy:Colonoscopy- [facility]]- polyp  # CKD: StageII- III  -GFR -52- stable.    .# DISPOSITION: # follow up in 6 months-MD/2 weeks prior-  cbc/cmp/M- protein/ lkappa-lamda light chains; iron studies/ferritin--Dr.B

## 2023-08-08 ENCOUNTER — Inpatient Hospital Stay: Payer: HMO

## 2023-08-09 ENCOUNTER — Inpatient Hospital Stay: Attending: Internal Medicine

## 2023-08-14 ENCOUNTER — Inpatient Hospital Stay: Attending: Internal Medicine

## 2023-08-14 DIAGNOSIS — Z800 Family history of malignant neoplasm of digestive organs: Secondary | ICD-10-CM | POA: Insufficient documentation

## 2023-08-14 DIAGNOSIS — F17210 Nicotine dependence, cigarettes, uncomplicated: Secondary | ICD-10-CM | POA: Insufficient documentation

## 2023-08-14 DIAGNOSIS — R634 Abnormal weight loss: Secondary | ICD-10-CM | POA: Diagnosis not present

## 2023-08-14 DIAGNOSIS — N183 Chronic kidney disease, stage 3 unspecified: Secondary | ICD-10-CM | POA: Insufficient documentation

## 2023-08-14 DIAGNOSIS — Z79899 Other long term (current) drug therapy: Secondary | ICD-10-CM | POA: Insufficient documentation

## 2023-08-14 DIAGNOSIS — C8309 Small cell B-cell lymphoma, extranodal and solid organ sites: Secondary | ICD-10-CM | POA: Insufficient documentation

## 2023-08-14 DIAGNOSIS — Z8042 Family history of malignant neoplasm of prostate: Secondary | ICD-10-CM | POA: Insufficient documentation

## 2023-08-14 DIAGNOSIS — C8300 Small cell B-cell lymphoma, unspecified site: Secondary | ICD-10-CM

## 2023-08-14 DIAGNOSIS — D649 Anemia, unspecified: Secondary | ICD-10-CM | POA: Diagnosis not present

## 2023-08-14 LAB — CBC (CANCER CENTER ONLY)
HCT: 37.3 % — ABNORMAL LOW (ref 39.0–52.0)
Hemoglobin: 12.2 g/dL — ABNORMAL LOW (ref 13.0–17.0)
MCH: 29.6 pg (ref 26.0–34.0)
MCHC: 32.7 g/dL (ref 30.0–36.0)
MCV: 90.5 fL (ref 80.0–100.0)
Platelet Count: 300 10*3/uL (ref 150–400)
RBC: 4.12 MIL/uL — ABNORMAL LOW (ref 4.22–5.81)
RDW: 15.6 % — ABNORMAL HIGH (ref 11.5–15.5)
WBC Count: 8.6 10*3/uL (ref 4.0–10.5)
nRBC: 0 % (ref 0.0–0.2)

## 2023-08-14 LAB — IRON AND TIBC
Iron: 36 ug/dL — ABNORMAL LOW (ref 45–182)
Saturation Ratios: 12 % — ABNORMAL LOW (ref 17.9–39.5)
TIBC: 298 ug/dL (ref 250–450)
UIBC: 262 ug/dL

## 2023-08-14 LAB — CMP (CANCER CENTER ONLY)
ALT: 13 U/L (ref 0–44)
AST: 18 U/L (ref 15–41)
Albumin: 3.5 g/dL (ref 3.5–5.0)
Alkaline Phosphatase: 54 U/L (ref 38–126)
Anion gap: 7 (ref 5–15)
BUN: 18 mg/dL (ref 8–23)
CO2: 24 mmol/L (ref 22–32)
Calcium: 8.8 mg/dL — ABNORMAL LOW (ref 8.9–10.3)
Chloride: 106 mmol/L (ref 98–111)
Creatinine: 1.82 mg/dL — ABNORMAL HIGH (ref 0.61–1.24)
GFR, Estimated: 40 mL/min — ABNORMAL LOW (ref >60)
Glucose, Bld: 109 mg/dL — ABNORMAL HIGH (ref 70–99)
Potassium: 3.7 mmol/L (ref 3.5–5.1)
Sodium: 137 mmol/L (ref 135–145)
Total Bilirubin: 0.3 mg/dL (ref 0.0–1.2)
Total Protein: 7.4 g/dL (ref 6.5–8.1)

## 2023-08-14 LAB — FERRITIN: Ferritin: 56 ng/mL (ref 24–336)

## 2023-08-15 LAB — KAPPA/LAMBDA LIGHT CHAINS
Kappa free light chain: 157.4 mg/L — ABNORMAL HIGH (ref 3.3–19.4)
Kappa, lambda light chain ratio: 8.37 — ABNORMAL HIGH (ref 0.26–1.65)
Lambda free light chains: 18.8 mg/L (ref 5.7–26.3)

## 2023-08-16 LAB — MULTIPLE MYELOMA PANEL, SERUM
Albumin SerPl Elph-Mcnc: 3.8 g/dL (ref 2.9–4.4)
Albumin/Glob SerPl: 1.2 (ref 0.7–1.7)
Alpha 1: 0.2 g/dL (ref 0.0–0.4)
Alpha2 Glob SerPl Elph-Mcnc: 0.6 g/dL (ref 0.4–1.0)
B-Globulin SerPl Elph-Mcnc: 0.8 g/dL (ref 0.7–1.3)
Gamma Glob SerPl Elph-Mcnc: 1.6 g/dL (ref 0.4–1.8)
Globulin, Total: 3.2 g/dL (ref 2.2–3.9)
IgA: 160 mg/dL (ref 61–437)
IgG (Immunoglobin G), Serum: 1033 mg/dL (ref 603–1613)
IgM (Immunoglobulin M), Srm: 950 mg/dL — ABNORMAL HIGH (ref 20–172)
M Protein SerPl Elph-Mcnc: 0.8 g/dL — ABNORMAL HIGH
Total Protein ELP: 7 g/dL (ref 6.0–8.5)

## 2023-08-22 ENCOUNTER — Encounter: Payer: Self-pay | Admitting: Internal Medicine

## 2023-08-22 ENCOUNTER — Inpatient Hospital Stay: Payer: HMO | Admitting: Internal Medicine

## 2023-08-22 VITALS — BP 116/73 | HR 62 | Temp 96.9°F | Resp 16 | Ht 66.0 in | Wt 145.4 lb

## 2023-08-22 DIAGNOSIS — C8300 Small cell B-cell lymphoma, unspecified site: Secondary | ICD-10-CM

## 2023-08-22 DIAGNOSIS — C8309 Small cell B-cell lymphoma, extranodal and solid organ sites: Secondary | ICD-10-CM | POA: Diagnosis not present

## 2023-08-22 NOTE — Progress Notes (Signed)
 [facility] OFFICE PROGRESS NOTE  Patient Care Team: Pcp, No as PCP - General Gwyn Leos, MD as Consulting Physician (Oncology)   SUMMARY OF ONCOLOGIC HISTORY:  . 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 CT [non-contrast]- January 2017- NED  # CKD [stage III creat 1.8]  DIAGNOSIS: lymphoplasmacytic lymphoma/Waldenstroms  STAGE:   IV      ;GOALS: control  CURRENT/MOST RECENT THERAPY: surveillaince   Malignant lymphoma, lymphoplasmacytic (HCC)    INTERVAL HISTORY: Alone.  Ambulating independently.  A very pleasant 68 -year-old male patient with above history of stage IV lymphoplasmacytic lymphoma/marginal zone lymphoma currently status post maintenance rituximab  [last treatment November 2016].  Patient denies any concerns at this time.  Continues to be physically active/still working.   Denies any new lumps or bumps.  Denies any tingling or numbness. No abdominal pain.  He states that he has lost weight-because he is not eating as much as he used to when he was working.  Otherwise denies any abdominal fullness or nausea or vomiting.  Review of Systems  Constitutional:  Negative for chills, diaphoresis, fever, malaise/fatigue and weight loss.  HENT:  Negative for nosebleeds and sore throat.   Eyes:  Negative for double vision.  Respiratory:  Negative for cough, hemoptysis, sputum production, shortness of breath and wheezing.   Cardiovascular:  Negative for chest pain, palpitations, orthopnea and leg swelling.  Gastrointestinal:  Negative for abdominal pain, blood in stool, constipation, diarrhea, heartburn, melena, nausea and vomiting.  Genitourinary:  Negative for  dysuria, frequency and urgency.  Musculoskeletal:  Negative for back pain and joint pain.  Skin: Negative.  Negative for itching and rash.  Neurological:  Negative for dizziness, tingling, focal weakness, weakness and headaches.  Endo/Heme/Allergies:  Does not bruise/bleed easily.  Psychiatric/Behavioral:  Negative for depression. The patient is not nervous/anxious and does not have insomnia.      PAST MEDICAL HISTORY :  Past Medical History:  Diagnosis Date   Anemia    Arthritis    Chronic kidney disease    GERD (gastroesophageal reflux disease)    Non Hodgkin's lymphoma (HCC)     PAST SURGICAL HISTORY :   Past Surgical History:  Procedure Laterality Date   BONE MARROW BIOPSY     Left eye surgery  2012   Left knee surgery  1981   ACL repair    FAMILY HISTORY :   Family History  Problem Relation Age of Onset   Pancreatic cancer Mother 31   Prostate cancer Brother 51   Prostate cancer Father 64    SOCIAL HISTORY:   Social History   Tobacco Use   Smoking status: Every Day    Current packs/day: 0.25    Average packs/day: 0.3 packs/day for 10.0 years (2.5 ttl pk-yrs)    Types: Cigarettes   Smokeless tobacco: Never   Tobacco comments:    "I only smoke in the bar on occasion"  Substance Use Topics   Alcohol use: Yes    Alcohol/week: 0.0 standard drinks of alcohol    Comment: consumes alcohol on the weekend.   Drug use: No    ALLERGIES:  is allergic to no known allergies.  MEDICATIONS:  Current Outpatient Medications  Medication Sig Dispense Refill  cyanocobalamin (VITAMIN B12) 1000 MCG tablet Take 1,000 mcg by mouth daily.     Fe Bisgly-Vit C-Vit B12-FA (GENTLE IRON PO) Take by mouth daily.     omeprazole (PRILOSEC) 20 MG capsule TAKE ONE CAPSULE BY MOUTH EVERY MORNING TO CONTROL STOMACH ACID.  TAKE 30 MINUTES BEFORE A MEAL     No current facility-administered medications for this visit.    PHYSICAL EXAMINATION: ECOG PERFORMANCE STATUS: 0 -  Asymptomatic  BP 116/73 (BP Location: Left Arm, Patient Position: Sitting, Cuff Size: Normal)   Pulse 62   Temp (!) 96.9 F (36.1 C) (Tympanic)   Resp 16   Ht 5\' 6"  (1.676 m)   Wt 145 lb 6.4 oz (66 kg)   SpO2 96%   BMI 23.47 kg/m   Filed Weights   08/22/23 0941  Weight: 145 lb 6.4 oz (66 kg)    Physical Exam Constitutional:      Comments: He is alone.   HENT:     Head: Normocephalic and atraumatic.     Mouth/Throat:     Pharynx: No oropharyngeal exudate.  Eyes:     Pupils: Pupils are equal, round, and reactive to light.  Cardiovascular:     Rate and Rhythm: Normal rate and regular rhythm.  Pulmonary:     Effort: No respiratory distress.     Breath sounds: No wheezing.  Abdominal:     General: Bowel sounds are normal. There is no distension.     Palpations: Abdomen is soft. There is no mass.     Tenderness: There is no abdominal tenderness. There is no guarding or rebound.  Musculoskeletal:        General: No tenderness. Normal range of motion.     Cervical back: Normal range of motion and neck supple.  Skin:    General: Skin is warm.  Neurological:     Mental Status: He is alert and oriented to person, place, and time.  Psychiatric:        Mood and Affect: Affect normal.     LABORATORY DATA:  I have reviewed the data as listed    Component Value Date/Time   NA 137 08/14/2023 0924   NA 139 11/08/2013 0824   K 3.7 08/14/2023 0924   K 4.3 11/08/2013 0824   CL 106 08/14/2023 0924   CL 104 11/08/2013 0824   CO2 24 08/14/2023 0924   CO2 27 11/08/2013 0824   GLUCOSE 109 (H) 08/14/2023 0924   GLUCOSE 92 11/08/2013 0824   BUN 18 08/14/2023 0924   BUN 18 11/08/2013 0824   CREATININE 1.82 (H) 08/14/2023 0924   CREATININE 1.85 (H) 04/25/2014 0840   CALCIUM 8.8 (L) 08/14/2023 0924   CALCIUM 8.8 11/08/2013 0824   PROT 7.4 08/14/2023 0924   PROT 6.6 02/28/2014 0902   ALBUMIN 3.5 08/14/2023 0924   ALBUMIN 3.5 02/28/2014 0902   AST 18 08/14/2023 0924   ALT 13  08/14/2023 0924   ALT 19 02/28/2014 0902   ALKPHOS 54 08/14/2023 0924   ALKPHOS 53 02/28/2014 0902   BILITOT 0.3 08/14/2023 0924   GFRNONAA 40 (L) 08/14/2023 0924   GFRNONAA 40 (L) 04/25/2014 0840   GFRNONAA 46 (L) 11/08/2013 0824   GFRAA >60 11/08/2019 0915   GFRAA 48 (L) 04/25/2014 0840   GFRAA 53 (L) 11/08/2013 0824    No results found for: "SPEP", "UPEP"  Lab Results  Component Value Date   WBC 8.6 08/14/2023   NEUTROABS 4.4 01/06/2023   HGB 12.2 (L)  08/14/2023   HCT 37.3 (L) 08/14/2023   MCV 90.5 08/14/2023   PLT 300 08/14/2023      Chemistry      Component Value Date/Time   NA 137 08/14/2023 0924   NA 139 11/08/2013 0824   K 3.7 08/14/2023 0924   K 4.3 11/08/2013 0824   CL 106 08/14/2023 0924   CL 104 11/08/2013 0824   CO2 24 08/14/2023 0924   CO2 27 11/08/2013 0824   BUN 18 08/14/2023 0924   BUN 18 11/08/2013 0824   CREATININE 1.82 (H) 08/14/2023 0924   CREATININE 1.85 (H) 04/25/2014 0840      Component Value Date/Time   CALCIUM 8.8 (L) 08/14/2023 0924   CALCIUM 8.8 11/08/2013 0824   ALKPHOS 54 08/14/2023 0924   ALKPHOS 53 02/28/2014 0902   AST 18 08/14/2023 0924   ALT 13 08/14/2023 0924   ALT 19 02/28/2014 0902   BILITOT 0.3 08/14/2023 0924       ASSESSMENT & PLAN:   Malignant lymphoma, lymphoplasmacytic (HCC) # 2014-  Malignant lymphoplasmacytic lymphoma-stage IV low-grade-completed maintenance Rituxan  02/2015.  CT 1/17 showed no evidence of disease.  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   # I discussed with the patient regarding the low but steady rising markers. Discussed if still rising at next visit- recommend work up with PET scab/bone marrow biopsy. Again reviewed the natural history of Elza Handing macroglobulinemia.  Dsicussed that we might recommend treatement- based on next labs/work up.  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  This pill is unlikely to cause stomach upset or cause constipation.   # weight loss- 5 pounds in last 6 months-patient attributes to changing eating habits since he is retired.  However monitor closely- work up above-.  At next visit.  # screening colonosopy:Colonoscopy- [facility]]- polyp  # CKD: Stage- III  -GFR -40  stable.    .# DISPOSITION: # follow up in 6 months-MD/2 weeks prior-  cbc/cmp/M- protein/ lkappa-lamda light chains; iron studies/ferritin--Dr.B     Gwyn Leos, MD 08/22/2023 12:28 PM

## 2023-08-22 NOTE — Progress Notes (Signed)
 No concerns today

## 2023-08-22 NOTE — Assessment & Plan Note (Addendum)
#   2014-  Malignant lymphoplasmacytic lymphoma-stage IV low-grade-completed maintenance Rituxan  02/2015.  CT 1/17 showed no evidence of disease.  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   # I discussed with the patient regarding the low but steady rising markers. Discussed if still rising at next visit- recommend work up with PET scab/bone marrow biopsy. Again reviewed the natural history of Elza Handing macroglobulinemia.  Dsicussed that we might recommend treatement- based on next labs/work up.  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  This pill is unlikely to cause stomach upset or cause constipation.   # weight loss- 5 pounds in last 6 months-patient attributes to changing eating habits since he is retired.  However monitor closely- work up above-.  At next visit.  # screening colonosopy:Colonoscopy- [facility]]- polyp  # CKD: Stage- III  -GFR -40  stable.    .# DISPOSITION: # follow up in 6 months-MD/2 weeks prior-  cbc/cmp/M- protein/ lkappa-lamda light chains; iron studies/ferritin--Dr.B

## 2023-12-14 DIAGNOSIS — C8300 Small cell B-cell lymphoma, unspecified site: Secondary | ICD-10-CM | POA: Diagnosis not present

## 2023-12-14 DIAGNOSIS — G8929 Other chronic pain: Secondary | ICD-10-CM | POA: Diagnosis not present

## 2023-12-14 DIAGNOSIS — N183 Chronic kidney disease, stage 3 unspecified: Secondary | ICD-10-CM | POA: Diagnosis not present

## 2023-12-14 DIAGNOSIS — F17210 Nicotine dependence, cigarettes, uncomplicated: Secondary | ICD-10-CM | POA: Diagnosis not present

## 2023-12-14 DIAGNOSIS — K219 Gastro-esophageal reflux disease without esophagitis: Secondary | ICD-10-CM | POA: Diagnosis not present

## 2024-02-08 ENCOUNTER — Inpatient Hospital Stay: Attending: Internal Medicine

## 2024-02-08 DIAGNOSIS — Z79899 Other long term (current) drug therapy: Secondary | ICD-10-CM | POA: Diagnosis not present

## 2024-02-08 DIAGNOSIS — C8300 Small cell B-cell lymphoma, unspecified site: Secondary | ICD-10-CM | POA: Insufficient documentation

## 2024-02-08 DIAGNOSIS — Z8042 Family history of malignant neoplasm of prostate: Secondary | ICD-10-CM | POA: Insufficient documentation

## 2024-02-08 DIAGNOSIS — F17210 Nicotine dependence, cigarettes, uncomplicated: Secondary | ICD-10-CM | POA: Diagnosis not present

## 2024-02-08 DIAGNOSIS — C8309 Small cell B-cell lymphoma, extranodal and solid organ sites: Secondary | ICD-10-CM | POA: Insufficient documentation

## 2024-02-08 DIAGNOSIS — Z800 Family history of malignant neoplasm of digestive organs: Secondary | ICD-10-CM | POA: Insufficient documentation

## 2024-02-08 DIAGNOSIS — N183 Chronic kidney disease, stage 3 unspecified: Secondary | ICD-10-CM | POA: Diagnosis not present

## 2024-02-08 DIAGNOSIS — R634 Abnormal weight loss: Secondary | ICD-10-CM | POA: Insufficient documentation

## 2024-02-08 DIAGNOSIS — D649 Anemia, unspecified: Secondary | ICD-10-CM | POA: Diagnosis not present

## 2024-02-08 LAB — CMP (CANCER CENTER ONLY)
ALT: 14 U/L (ref 0–44)
AST: 21 U/L (ref 15–41)
Albumin: 3.6 g/dL (ref 3.5–5.0)
Alkaline Phosphatase: 52 U/L (ref 38–126)
Anion gap: 8 (ref 5–15)
BUN: 20 mg/dL (ref 8–23)
CO2: 22 mmol/L (ref 22–32)
Calcium: 9.1 mg/dL (ref 8.9–10.3)
Chloride: 107 mmol/L (ref 98–111)
Creatinine: 1.9 mg/dL — ABNORMAL HIGH (ref 0.61–1.24)
GFR, Estimated: 38 mL/min — ABNORMAL LOW (ref >60)
Glucose, Bld: 120 mg/dL — ABNORMAL HIGH (ref 70–99)
Potassium: 3.7 mmol/L (ref 3.5–5.1)
Sodium: 137 mmol/L (ref 135–145)
Total Bilirubin: 0.6 mg/dL (ref 0.0–1.2)
Total Protein: 7.4 g/dL (ref 6.5–8.1)

## 2024-02-08 LAB — CBC WITH DIFFERENTIAL (CANCER CENTER ONLY)
Abs Immature Granulocytes: 0.02 K/uL (ref 0.00–0.07)
Basophils Absolute: 0 K/uL (ref 0.0–0.1)
Basophils Relative: 1 %
Eosinophils Absolute: 0.1 K/uL (ref 0.0–0.5)
Eosinophils Relative: 2 %
HCT: 35.6 % — ABNORMAL LOW (ref 39.0–52.0)
Hemoglobin: 11.7 g/dL — ABNORMAL LOW (ref 13.0–17.0)
Immature Granulocytes: 0 %
Lymphocytes Relative: 29 %
Lymphs Abs: 2.2 K/uL (ref 0.7–4.0)
MCH: 29.4 pg (ref 26.0–34.0)
MCHC: 32.9 g/dL (ref 30.0–36.0)
MCV: 89.4 fL (ref 80.0–100.0)
Monocytes Absolute: 0.9 K/uL (ref 0.1–1.0)
Monocytes Relative: 11 %
Neutro Abs: 4.4 K/uL (ref 1.7–7.7)
Neutrophils Relative %: 57 %
Platelet Count: 297 K/uL (ref 150–400)
RBC: 3.98 MIL/uL — ABNORMAL LOW (ref 4.22–5.81)
RDW: 16 % — ABNORMAL HIGH (ref 11.5–15.5)
WBC Count: 7.7 K/uL (ref 4.0–10.5)
nRBC: 0 % (ref 0.0–0.2)

## 2024-02-08 LAB — IRON AND TIBC
Iron: 57 ug/dL (ref 45–182)
Saturation Ratios: 20 % (ref 17.9–39.5)
TIBC: 293 ug/dL (ref 250–450)
UIBC: 236 ug/dL

## 2024-02-08 LAB — FERRITIN: Ferritin: 56 ng/mL (ref 24–336)

## 2024-02-09 LAB — KAPPA/LAMBDA LIGHT CHAINS
Kappa free light chain: 230.1 mg/L — ABNORMAL HIGH (ref 3.3–19.4)
Kappa, lambda light chain ratio: 8.16 — ABNORMAL HIGH (ref 0.26–1.65)
Lambda free light chains: 28.2 mg/L — ABNORMAL HIGH (ref 5.7–26.3)

## 2024-02-13 LAB — FACTOR 5 LEIDEN

## 2024-02-22 ENCOUNTER — Inpatient Hospital Stay: Attending: Internal Medicine | Admitting: Internal Medicine

## 2024-03-18 ENCOUNTER — Inpatient Hospital Stay: Attending: Internal Medicine | Admitting: Internal Medicine

## 2024-03-18 ENCOUNTER — Encounter: Payer: Self-pay | Admitting: Internal Medicine

## 2024-03-18 DIAGNOSIS — Z800 Family history of malignant neoplasm of digestive organs: Secondary | ICD-10-CM | POA: Insufficient documentation

## 2024-03-18 DIAGNOSIS — C8300 Small cell B-cell lymphoma, unspecified site: Secondary | ICD-10-CM | POA: Diagnosis not present

## 2024-03-18 DIAGNOSIS — F17210 Nicotine dependence, cigarettes, uncomplicated: Secondary | ICD-10-CM | POA: Insufficient documentation

## 2024-03-18 DIAGNOSIS — N183 Chronic kidney disease, stage 3 unspecified: Secondary | ICD-10-CM | POA: Diagnosis not present

## 2024-03-18 DIAGNOSIS — Z8042 Family history of malignant neoplasm of prostate: Secondary | ICD-10-CM | POA: Diagnosis not present

## 2024-03-18 DIAGNOSIS — D649 Anemia, unspecified: Secondary | ICD-10-CM | POA: Insufficient documentation

## 2024-03-18 DIAGNOSIS — Z79899 Other long term (current) drug therapy: Secondary | ICD-10-CM | POA: Insufficient documentation

## 2024-03-18 NOTE — Progress Notes (Signed)
 [facility] OFFICE PROGRESS NOTE  Patient Care Team: Pcp, No as PCP - General Rennie Cindy SAUNDERS, MD as Consulting Physician (Oncology)   SUMMARY OF ONCOLOGIC HISTORY:  . 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 CT [non-contrast]- January 2017- NED  # CKD [stage III creat 1.8]  DIAGNOSIS: lymphoplasmacytic lymphoma/Waldenstroms  STAGE:   IV      ;GOALS: control  CURRENT/MOST RECENT THERAPY: surveillaince   Malignant lymphoma, lymphoplasmacytic (HCC)    INTERVAL HISTORY: Alone.  Ambulating independently.  A very pleasant 68 -year-old male patient with above history of stage IV lymphoplasmacytic lymphoma/marginal zone lymphoma currently status post maintenance rituximab  [last treatment November 2016] currently on surviellaince.   Discussed the use of AI scribe software for clinical note transcription with the patient, who gave verbal consent to proceed.  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  There has been a gradual increase in M protein levels (from 0.2 to 0.8) and rising kappa light chain values. He has not received cancer-directed therapy since 2016.  He is currently asymptomatic from his malignancy, denying pain, fevers, night sweats, or weight loss. He recently experienced a mild upper respiratory infection, managed conservatively at home, and has no ongoing infectious symptoms.  He has iron deficiency anemia and is prescribed iron supplementation, but reports inconsistent adherence. He was unable to  obtain his preferred formulation (Gentle Iron) and is currently taking an alternative iron supplement.      Review of Systems  Constitutional:  Negative for chills, diaphoresis, fever, malaise/fatigue and weight loss.  HENT:  Negative for nosebleeds and sore throat.   Eyes:  Negative for double vision.  Respiratory:  Negative for cough, hemoptysis, sputum production, shortness of breath and wheezing.   Cardiovascular:  Negative for chest pain, palpitations, orthopnea and leg swelling.  Gastrointestinal:  Negative for abdominal pain, blood in stool, constipation, diarrhea, heartburn, melena, nausea and vomiting.  Genitourinary:  Negative for dysuria, frequency and urgency.  Musculoskeletal:  Negative for back pain and joint pain.  Skin: Negative.  Negative for itching and rash.  Neurological:  Negative for dizziness, tingling, focal weakness, weakness and headaches.  Endo/Heme/Allergies:  Does not bruise/bleed easily.  Psychiatric/Behavioral:  Negative for depression. The patient is not nervous/anxious and does not have insomnia.      PAST MEDICAL HISTORY :  Past Medical History:  Diagnosis Date   Anemia    Arthritis    Chronic kidney disease    GERD (gastroesophageal reflux disease)    Non Hodgkin's lymphoma (HCC)     PAST SURGICAL HISTORY :   Past Surgical History:  Procedure Laterality Date   BONE MARROW BIOPSY     Left eye surgery  2012   Left knee surgery  1981   ACL repair    FAMILY HISTORY :   Family History  Problem Relation Age of Onset   Pancreatic cancer Mother 62   Prostate cancer Brother 74   Prostate cancer Father 38    SOCIAL HISTORY:   Social History   Tobacco Use   Smoking status: Every Day    Current packs/day:  0.25    Average packs/day: 0.3 packs/day for 10.0 years (2.5 ttl pk-yrs)    Types: Cigarettes   Smokeless tobacco: Never   Tobacco comments:    I only smoke in the bar on occasion  Substance Use Topics   Alcohol use: Yes     Alcohol/week: 0.0 standard drinks of alcohol    Comment: consumes alcohol on the weekend.   Drug use: No    ALLERGIES:  is allergic to no known allergies.  MEDICATIONS:  Current Outpatient Medications  Medication Sig Dispense Refill   cyanocobalamin (VITAMIN B12) 1000 MCG tablet Take 1,000 mcg by mouth daily.     Fe Bisgly-Vit C-Vit B12-FA (GENTLE IRON PO) Take by mouth daily.     omeprazole (PRILOSEC) 20 MG capsule TAKE ONE CAPSULE BY MOUTH EVERY MORNING TO CONTROL STOMACH ACID.  TAKE 30 MINUTES BEFORE A MEAL     No current facility-administered medications for this visit.    PHYSICAL EXAMINATION: ECOG PERFORMANCE STATUS: 0 - Asymptomatic  BP 118/80 (BP Location: Left Arm, Patient Position: Sitting, Cuff Size: Normal)   Pulse 89   Temp 98 F (36.7 C) (Tympanic)   Resp 16   Ht 5' 6 (1.676 m)   Wt 144 lb 12.8 oz (65.7 kg)   SpO2 99%   BMI 23.37 kg/m   Filed Weights   03/18/24 1517  Weight: 144 lb 12.8 oz (65.7 kg)    Physical Exam Constitutional:      Comments: He is alone.   HENT:     Head: Normocephalic and atraumatic.     Mouth/Throat:     Pharynx: No oropharyngeal exudate.  Eyes:     Pupils: Pupils are equal, round, and reactive to light.  Cardiovascular:     Rate and Rhythm: Normal rate and regular rhythm.  Pulmonary:     Effort: No respiratory distress.     Breath sounds: No wheezing.  Abdominal:     General: Bowel sounds are normal. There is no distension.     Palpations: Abdomen is soft. There is no mass.     Tenderness: There is no abdominal tenderness. There is no guarding or rebound.  Musculoskeletal:        General: No tenderness. Normal range of motion.     Cervical back: Normal range of motion and neck supple.  Skin:    General: Skin is warm.  Neurological:     Mental Status: He is alert and oriented to person, place, and time.  Psychiatric:        Mood and Affect: Affect normal.     LABORATORY DATA:  I have reviewed the data as  listed    Component Value Date/Time   NA 137 02/08/2024 1003   NA 139 11/08/2013 0824   K 3.7 02/08/2024 1003   K 4.3 11/08/2013 0824   CL 107 02/08/2024 1003   CL 104 11/08/2013 0824   CO2 22 02/08/2024 1003   CO2 27 11/08/2013 0824   GLUCOSE 120 (H) 02/08/2024 1003   GLUCOSE 92 11/08/2013 0824   BUN 20 02/08/2024 1003   BUN 18 11/08/2013 0824   CREATININE 1.90 (H) 02/08/2024 1003   CREATININE 1.85 (H) 04/25/2014 0840   CALCIUM 9.1 02/08/2024 1003   CALCIUM 8.8 11/08/2013 0824   PROT 7.4 02/08/2024 1003   PROT 6.6 02/28/2014 0902   ALBUMIN 3.6 02/08/2024 1003   ALBUMIN 3.5 02/28/2014 0902   AST 21 02/08/2024 1003   ALT 14 02/08/2024 1003   ALT  19 02/28/2014 0902   ALKPHOS 52 02/08/2024 1003   ALKPHOS 53 02/28/2014 0902   BILITOT 0.6 02/08/2024 1003   GFRNONAA 38 (L) 02/08/2024 1003   GFRNONAA 40 (L) 04/25/2014 0840   GFRNONAA 46 (L) 11/08/2013 0824   GFRAA >60 11/08/2019 0915   GFRAA 48 (L) 04/25/2014 0840   GFRAA 53 (L) 11/08/2013 0824    No results found for: SPEP, UPEP  Lab Results  Component Value Date   WBC 7.7 02/08/2024   NEUTROABS 4.4 02/08/2024   HGB 11.7 (L) 02/08/2024   HCT 35.6 (L) 02/08/2024   MCV 89.4 02/08/2024   PLT 297 02/08/2024      Chemistry      Component Value Date/Time   NA 137 02/08/2024 1003   NA 139 11/08/2013 0824   K 3.7 02/08/2024 1003   K 4.3 11/08/2013 0824   CL 107 02/08/2024 1003   CL 104 11/08/2013 0824   CO2 22 02/08/2024 1003   CO2 27 11/08/2013 0824   BUN 20 02/08/2024 1003   BUN 18 11/08/2013 0824   CREATININE 1.90 (H) 02/08/2024 1003   CREATININE 1.85 (H) 04/25/2014 0840      Component Value Date/Time   CALCIUM 9.1 02/08/2024 1003   CALCIUM 8.8 11/08/2013 0824   ALKPHOS 52 02/08/2024 1003   ALKPHOS 53 02/28/2014 0902   AST 21 02/08/2024 1003   ALT 14 02/08/2024 1003   ALT 19 02/28/2014 0902   BILITOT 0.6 02/08/2024 1003       ASSESSMENT & PLAN:   Malignant lymphoma, lymphoplasmacytic (HCC) #  2014-  Malignant lymphoplasmacytic lymphoma-stage IV low-grade-completed maintenance Rituxan  02/2015.  CT 1/17 showed no evidence of disease.  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  # I discussed with the patient regarding the low but steady rising markers. GIVEN RISING MARKERS-  recommend work up with PET scab/bone marrow biopsy. Again reviewed the natural history of Elpidio Saucier macroglobulinemia.    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    # screening colonosopy:[facility]oscopy- [facility]]- polyp  # CKD: Stage- III   stable.  .# DISPOSITION: # PET scan # Bone marrow Biopsy in 1 week # follow up in 2-3 weeks- MD; no labs- Dr.B     Cindy JONELLE Joe, MD 03/18/2024 4:00 PM

## 2024-03-18 NOTE — Assessment & Plan Note (Addendum)
#   2014-  Malignant lymphoplasmacytic lymphoma-stage IV low-grade-completed maintenance Rituxan  02/2015.  CT 1/17 showed no evidence of disease.  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  # I discussed with the patient regarding the low but steady rising markers. GIVEN RISING MARKERS-  recommend work up with PET scab/bone marrow biopsy. Again reviewed the natural history of Elpidio Saucier macroglobulinemia.    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    # screening colonosopy:Colonoscopy- [facility]]- polyp  # CKD: Stage- III   stable.  .# DISPOSITION: # PET scan # Bone marrow Biopsy in 1 week # follow up in 2-3 weeks- MD; no labs- Dr.B

## 2024-03-18 NOTE — Progress Notes (Signed)
 No concerns today

## 2024-03-19 ENCOUNTER — Telehealth: Payer: Self-pay | Admitting: *Deleted

## 2024-03-19 NOTE — Telephone Encounter (Signed)
 Contacted patient regarding his Bone Marrow Biopsy. Appt is scheduled for 03/27/24 [facility] Nothing to eat or drink after midnight as moderate sedation will be used. He will have a driver with him.

## 2024-03-24 ENCOUNTER — Encounter: Payer: Self-pay | Admitting: Radiology

## 2024-03-24 NOTE — H&P (Shared)
 Chief Complaint: History of small cell B cell lymphoma. Foing to have rising MGUS marjers. Request is for bone marrow biopsy   Referring Physician(s): Rennie Cindy SAUNDERS  Supervising Physician: {Supervising Physician:[id number]}  Patient Status: {IR Consult Patient Status:21879}  History of Present Illness: Terry Douglas is a 68 y.o. male outpatient. 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 Team is requesting a bone marrow biopsy for further evaluation.   Currently without any significant complaints. Patient alert and laying in bed,calm. Denies any fevers, headache, chest pain, SOB, cough, abdominal pain, nausea, vomiting or bleeding.   *** Labs pending. All medications are within acceptable parameters. NKDA, Patient has  been NPO since midnight.  Return precautions and treatment recommendations and follow-up discussed with the patient *** who is agreeable with the plan.     Past Medical History:  Diagnosis Date   Anemia    Arthritis    Chronic kidney disease    GERD (gastroesophageal reflux disease)    Non Hodgkin's lymphoma (HCC)     Past Surgical History:  Procedure Laterality Date   BONE MARROW BIOPSY     Left eye surgery  2012   Left knee surgery  1981   ACL repair    Allergies: No known allergies  Medications: Prior to Admission medications  Medication Sig Start Date End Date Taking? Authorizing Provider  cyanocobalamin (VITAMIN B12) 1000 MCG tablet Take 1,000 mcg by mouth daily.    [provider]  Fe Bisgly-Vit C-Vit B12-FA (GENTLE IRON PO) Take by mouth daily.    [provider]  omeprazole (PRILOSEC) 20 MG capsule TAKE ONE CAPSULE BY MOUTH EVERY MORNING TO CONTROL STOMACH ACID.  TAKE 30 MINUTES BEFORE A MEAL 11/18/20   [provider]     Family History  Problem Relation Age of Onset   Pancreatic cancer Mother 45    Prostate cancer Brother 67   Prostate cancer Father 59    Social History   Socioeconomic History   Marital status: Single    Spouse name: Not on file   Number of children: Not on file   Years of education: Not on file   Highest education level: Not on file  Occupational History   Not on file  Tobacco Use   Smoking status: Every Day    Current packs/day: 0.25    Average packs/day: 0.3 packs/day for 10.0 years (2.5 ttl pk-yrs)    Types: Cigarettes   Smokeless tobacco: Never   Tobacco comments:    I only smoke in the bar on occasion  Substance and Sexual Activity   Alcohol use: Yes    Alcohol/week: 0.0 standard drinks of alcohol    Comment: consumes alcohol on the weekend.   Drug use: No   Sexual activity: Never  Other Topics Concern   Not on file  Social History Narrative   Not on file   Social Drivers of Health   Tobacco Use: High Risk (03/18/2024)   Patient History    Smoking Tobacco Use: Every Day    Smokeless Tobacco Use: Never    Passive Exposure: Not on file  Financial Resource Strain: Not on file  Food Insecurity: Not on file  Transportation Needs: Not on file  Physical Activity: Not on file  Stress: Not on file  Social Connections: Not on file  Depression (PHQ2-9): Low Risk (03/18/2024)   Depression (PHQ2-9)    PHQ-2 Score:  0  Alcohol Screen: Not on file  Housing: Not on file  Utilities: Not on file  Health Literacy: Not on file    ECOG Status: {CHL ONC ECOG ED:8845999799}  Review of Systems: A 12 point ROS discussed and pertinent positives are indicated in the HPI above.  All other systems are negative.  Review of Systems  Vital Signs: There were no vitals taken for this visit.  Advance Care Plan: {Advance Care Eojw:73180}    Physical Exam  Imaging: No results found.  Labs:  CBC: Recent Labs    08/14/23 0924 02/08/24 1003  WBC 8.6 7.7  HGB 12.2* 11.7*  HCT 37.3* 35.6*  PLT 300 297    COAGS: No results for input(s): INR,  APTT in the last 8760 hours.  BMP: Recent Labs    08/14/23 0924 02/08/24 1003  NA 137 137  K 3.7 3.7  CL 106 107  CO2 24 22  GLUCOSE 109* 120*  BUN 18 20  CALCIUM 8.8* 9.1  CREATININE 1.82* 1.90*  GFRNONAA 40* 38*    LIVER FUNCTION TESTS: Recent Labs    08/14/23 0924 02/08/24 1003  BILITOT 0.3 0.6  AST 18 21  ALT 13 14  ALKPHOS 54 52  PROT 7.4 7.4  ALBUMIN 3.5 3.6    TUMOR MARKERS: No results for input(s): AFPTM, CEA, CA199, CHROMGRNA in the last 8760 hours.  Assessment and Plan:  68 y.o male outpatient. 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 Team is requesting a bone marrow biopsy for further evaluation.  PLAN: IR Image Guided Bone Marrow Biopsy  Risks and benefits of bone marrow biopsy  was discussed with the patient and/or patient's family including, but not limited to bleeding, infection, damage to adjacent structures or low yield requiring additional tests.  All of the questions were answered and there is agreement to proceed.  Consent signed and in chart.    Thank you for this interesting consult.  I greatly enjoyed meeting Terry Douglas and look forward to participating in their care.  A copy of this report was sent to the requesting provider on this date.  Electronically Signed: Delon JAYSON Beagle, NP 03/24/2024, 8:59 PM   I spent a total of {New PWEU:[id number]} {New Out-Pt:[id number]}  {Established Out-Pt:304952003} in face to face in clinical consultation, greater than 50% of which was counseling/coordinating care for ***

## 2024-03-26 ENCOUNTER — Other Ambulatory Visit: Payer: Self-pay | Admitting: Radiology

## 2024-03-26 ENCOUNTER — Ambulatory Visit

## 2024-03-26 ENCOUNTER — Other Ambulatory Visit (HOSPITAL_COMMUNITY): Payer: Self-pay | Admitting: Radiology

## 2024-03-26 ENCOUNTER — Ambulatory Visit: Admission: RE | Admit: 2024-03-26 | Discharge: 2024-03-26 | Attending: Internal Medicine

## 2024-03-26 ENCOUNTER — Telehealth: Payer: Self-pay

## 2024-03-26 DIAGNOSIS — C8300 Small cell B-cell lymphoma, unspecified site: Secondary | ICD-10-CM | POA: Diagnosis present

## 2024-03-26 DIAGNOSIS — C8590 Non-Hodgkin lymphoma, unspecified, unspecified site: Secondary | ICD-10-CM | POA: Insufficient documentation

## 2024-03-26 DIAGNOSIS — N400 Enlarged prostate without lower urinary tract symptoms: Secondary | ICD-10-CM | POA: Insufficient documentation

## 2024-03-26 DIAGNOSIS — Z9221 Personal history of antineoplastic chemotherapy: Secondary | ICD-10-CM | POA: Diagnosis not present

## 2024-03-26 DIAGNOSIS — I2510 Atherosclerotic heart disease of native coronary artery without angina pectoris: Secondary | ICD-10-CM | POA: Insufficient documentation

## 2024-03-26 DIAGNOSIS — I700 Atherosclerosis of aorta: Secondary | ICD-10-CM | POA: Insufficient documentation

## 2024-03-26 DIAGNOSIS — N323 Diverticulum of bladder: Secondary | ICD-10-CM | POA: Insufficient documentation

## 2024-03-26 LAB — GLUCOSE, CAPILLARY: Glucose-Capillary: 86 mg/dL (ref 70–99)

## 2024-03-26 MED ORDER — FLUDEOXYGLUCOSE F - 18 (FDG) INJECTION
7.5000 | Freq: Once | INTRAVENOUS | Status: AC | PRN
  Administered 2024-03-26: 10:00:00 7.79 via INTRAVENOUS

## 2024-03-26 NOTE — H&P (Incomplete)
 Chief Complaint:  Malignant Lymphoma, Lymphoplasmacytic  Procedure: Bone Marrow Biopsy with Aspiration  Referring Provider(s): Dr. KANDICE Joe  Supervising Physician: Jenna Hacker  Patient Status: [facility] - Out-pt  History of Present Illness: Terry Douglas is a 68 y.o. male with a history of iron deficiency anemia, CKD, and stage IV lymphoplsmacytic lymphoma/marginal zone lymphoma. Patient has previously undergone maintenance rituximab  and has been undergoing surveillance. Unfortunately, at his last follow up with Dr. Joe, patient was noted to have worsening disease markers. He has had a gradual increase in M protein levels from 0.2 to 0.8 and rising kappa light chain values. His last cancer-directed therapy was in 2016. Given his rising markers, it was recommended that patient undergo PET scan and bone marrow biopsy for evaluation of current disease state. PET scan completed on 12/16 with results pending. He has been referred to IR for bone marrow biopsy.  Patient presents today with *** Currently resting in bed in no acute distress. States that he has been doing well overall *** Denies any chest pain, shortness of breath, fevers/chills, abdominal pain, or nausea/vomiting. NPO since midnight. All questions and concerns answered at the bedside.   Patient is Full Code  Past Medical History:  Diagnosis Date   Anemia    Arthritis    Chronic kidney disease    GERD (gastroesophageal reflux disease)    Non Hodgkin's lymphoma (HCC)     Past Surgical History:  Procedure Laterality Date   BONE MARROW BIOPSY     Left eye surgery  2012   Left knee surgery  1981   ACL repair    Allergies: No known allergies  Medications: Prior to Admission medications  Medication Sig Start Date End Date Taking? Authorizing Provider  cyanocobalamin (VITAMIN B12) 1000 MCG tablet Take 1,000 mcg by mouth daily.    [provider]  Fe Bisgly-Vit C-Vit B12-FA (GENTLE IRON PO) Take by  mouth daily.    [provider]  omeprazole (PRILOSEC) 20 MG capsule TAKE ONE CAPSULE BY MOUTH EVERY MORNING TO CONTROL STOMACH ACID.  TAKE 30 MINUTES BEFORE A MEAL 11/18/20   [provider]     Family History  Problem Relation Age of Onset   Pancreatic cancer Mother 83   Prostate cancer Brother 49   Prostate cancer Father 72    Social History   Socioeconomic History   Marital status: Single    Spouse name: Not on file   Number of children: Not on file   Years of education: Not on file   Highest education level: Not on file  Occupational History   Not on file  Tobacco Use   Smoking status: Every Day    Current packs/day: 0.25    Average packs/day: 0.3 packs/day for 10.0 years (2.5 ttl pk-yrs)    Types: Cigarettes   Smokeless tobacco: Never   Tobacco comments:    I only smoke in the bar on occasion  Substance and Sexual Activity   Alcohol use: Yes    Alcohol/week: 0.0 standard drinks of alcohol    Comment: consumes alcohol on the weekend.   Drug use: No   Sexual activity: Never  Other Topics Concern   Not on file  Social History Narrative   Not on file   Social Drivers of Health   Tobacco Use: High Risk (03/18/2024)   Patient History    Smoking Tobacco Use: Every Day    Smokeless Tobacco Use: Never    Passive Exposure: Not on  Actuary Strain: Not on file  Food Insecurity: Not on file  Transportation Needs: Not on file  Physical Activity: Not on file  Stress: Not on file  Social Connections: Not on file  Depression (PHQ2-9): Low Risk (03/18/2024)   Depression (PHQ2-9)    PHQ-2 Score: 0  Alcohol Screen: Not on file  Housing: Not on file  Utilities: Not on file  Health Literacy: Not on file    Review of Systems Patient denies any headache, chest pain, shortness of breath, abdominal pain, N/V, or fever/chills. All other systems are negative.   Vital Signs: There were no vitals taken for this visit.   Physical  Exam  Imaging: No results found.  Labs:  CBC: Recent Labs    08/14/23 0924 02/08/24 1003  WBC 8.6 7.7  HGB 12.2* 11.7*  HCT 37.3* 35.6*  PLT 300 297    COAGS: No results for input(s): INR, APTT in the last 8760 hours.  BMP: Recent Labs    08/14/23 0924 02/08/24 1003  NA 137 137  K 3.7 3.7  CL 106 107  CO2 24 22  GLUCOSE 109* 120*  BUN 18 20  CALCIUM 8.8* 9.1  CREATININE 1.82* 1.90*  GFRNONAA 40* 38*    LIVER FUNCTION TESTS: Recent Labs    08/14/23 0924 02/08/24 1003  BILITOT 0.3 0.6  AST 18 21  ALT 13 14  ALKPHOS 54 52  PROT 7.4 7.4  ALBUMIN 3.5 3.6    TUMOR MARKERS: No results for input(s): AFPTM, CEA, CA199, CHROMGRNA in the last 8760 hours.  Assessment and Plan:  Stage IV Malignant Lymphoma w/ rising markers: Terry Douglas is a 68 y.o. male with a history of iron deficiency anemia, CKD, and relapsed lymphoma who presents to [facility] Inter[facility] Radiology department for an image-guided bone marrow biopsy with aspiration with Dr. KANDICE Banner. Procedure to be performed under moderate sedation.  Risks and benefits of bone marrow biopsy was discussed with the patient and/or patient's family including, but not limited to bleeding, infection, damage to adjacent structures or low yield requiring additional tests.  All of the questions were answered and there is agreement to proceed.  Consent signed and in chart.   Thank you for allowing our service to participate in Terry Douglas 's care.    Electronically Signed: Edwen Mclester M Marabelle Cushman, PA-C   03/26/2024, 2:56 PM     I spent a total of {New Out-Pt:[id number]} in face to face in clinical consultation, greater than 50% of which was counseling/coordinating care for MGUS for bone marrow biopsy.

## 2024-03-26 NOTE — Telephone Encounter (Signed)
 Patient for IR Bone Marrow Biopsy on Wed 03/27/24, I called and spoke with the patient on the phone and gave pre-procedure instructions. Pt was made aware to be here at 7:30a, NPO after MN prior to procedure as well as driver post procedure/recovery/discharge. Pt stated understanding.  Called 03/26/24

## 2024-03-26 NOTE — H&P (Shared)
 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 Team is requesting a bone marrow biopsy for further evaluation   Referring Physician(s): Rennie Lighter R  Supervising Physician: {Supervising Physician:[id number]}  Patient Status: [facility] - Out-pt  History of Present Illness: Terry Douglas is a 68 y.o. male outpatient. 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 Team is requesting a bone marrow biopsy for further evaluation.  Currently without any significant complaints. Patient alert and laying in bed,calm. Denies any fevers, headache, chest pain, SOB, cough, abdominal pain, nausea, vomiting or bleeding.   ***Labs pending. All medications are within acceptable parameters. NKDA. Patient has been NPO since midnight.   Return precautions and treatment recommendations and follow-up discussed with the patient *** who is agreeable with the plan.    Past Medical History:  Diagnosis Date   Anemia    Arthritis    Chronic kidney disease    GERD (gastroesophageal reflux disease)    Non Hodgkin's lymphoma (HCC)     Past Surgical History:  Procedure Laterality Date   BONE MARROW BIOPSY     Left eye surgery  2012   Left knee surgery  1981   ACL repair    Allergies: No known allergies  Medications: Prior to Admission medications  Medication Sig Start Date End Date Taking? Authorizing Provider  cyanocobalamin (VITAMIN B12) 1000 MCG tablet Take 1,000 mcg by mouth daily.    [provider]  Fe Bisgly-Vit C-Vit B12-FA (GENTLE IRON PO) Take by mouth daily.    [provider]  omeprazole (PRILOSEC) 20 MG capsule TAKE ONE CAPSULE BY MOUTH EVERY MORNING TO CONTROL STOMACH ACID.  TAKE 30 MINUTES BEFORE A MEAL 11/18/20   [provider]     Family History  Problem Relation Age of Onset   Pancreatic cancer Mother 57    Prostate cancer Brother 5   Prostate cancer Father 88    Social History   Socioeconomic History   Marital status: Single    Spouse name: Not on file   Number of children: Not on file   Years of education: Not on file   Highest education level: Not on file  Occupational History   Not on file  Tobacco Use   Smoking status: Every Day    Current packs/day: 0.25    Average packs/day: 0.3 packs/day for 10.0 years (2.5 ttl pk-yrs)    Types: Cigarettes   Smokeless tobacco: Never   Tobacco comments:    I only smoke in the bar on occasion  Substance and Sexual Activity   Alcohol use: Yes    Alcohol/week: 0.0 standard drinks of alcohol    Comment: consumes alcohol on the weekend.   Drug use: No   Sexual activity: Never  Other Topics Concern   Not on file  Social History Narrative   Not on file   Social Drivers of Health   Tobacco Use: High Risk (03/18/2024)   Patient History    Smoking Tobacco Use: Every Day    Smokeless Tobacco Use: Never    Passive Exposure: Not on file  Financial Resource Strain: Not on file  Food Insecurity: Not on file  Transportation Needs: Not on file  Physical Activity: Not on file  Stress: Not on file  Social Connections: Not on file  Depression (PHQ2-9): Low Risk (03/18/2024)   Depression (PHQ2-9)    PHQ-2 Score:  0  Alcohol Screen: Not on file  Housing: Not on file  Utilities: Not on file  Health Literacy: Not on file    ECOG Status: {CHL ONC ECOG ED:[id number]}  Review of Systems: A 12 point ROS discussed and pertinent positives are indicated in the HPI above.  All other systems are negative.  Review of Systems  Vital Signs: There were no vitals taken for this visit.  Advance Care Plan: {Advance Care Eojw:73180}    Physical Exam  Imaging: No results found.  Labs:  CBC: Recent Labs    08/14/23 0924 02/08/24 1003  WBC 8.6 7.7  HGB 12.2* 11.7*  HCT 37.3* 35.6*  PLT 300 297    COAGS: No results for input(s): INR,  APTT in the last 8760 hours.  BMP: Recent Labs    08/14/23 0924 02/08/24 1003  NA 137 137  K 3.7 3.7  CL 106 107  CO2 24 22  GLUCOSE 109* 120*  BUN 18 20  CALCIUM 8.8* 9.1  CREATININE 1.82* 1.90*  GFRNONAA 40* 38*    LIVER FUNCTION TESTS: Recent Labs    08/14/23 0924 02/08/24 1003  BILITOT 0.3 0.6  AST 18 21  ALT 13 14  ALKPHOS 54 52  PROT 7.4 7.4  ALBUMIN 3.5 3.6    TUMOR MARKERS: No results for input(s): AFPTM, CEA, CA199, CHROMGRNA in the last 8760 hours.  Assessment and Plan:  68 y.o male outpatient. 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 Team is requesting a bone marrow biopsy for further evaluation.   PLAN: IR Image Guided Bone Marrow Biopsy   Risks and benefits of bone marrow biopsy was discussed with the patient and/or patient's family including, but not limited to bleeding, infection, damage to adjacent structures or low yield requiring additional tests.  All of the questions were answered and there is agreement to proceed.  Consent signed and in chart.   Thank you for this interesting consult.  I greatly enjoyed meeting HARBERT FITTERER and look forward to participating in their care.  A copy of this report was sent to the requesting provider on this date.  Electronically Signed: Delon JAYSON Beagle, NP 03/26/2024, 8:37 PM   I spent a total of {New PWEU:[id number]} {New Out-Pt:[id number]}  {Established Out-Pt:[id number]} in face to face in clinical consultation, greater than 50% of which was counseling/coordinating care for ***

## 2024-03-27 ENCOUNTER — Ambulatory Visit
Admission: RE | Admit: 2024-03-27 | Discharge: 2024-03-27 | Disposition: A | Discharge status: Home / Self Care | Source: Ambulatory Visit | Attending: Internal Medicine | Admitting: Internal Medicine

## 2024-03-27 ENCOUNTER — Telehealth: Payer: Self-pay | Admitting: Internal Medicine

## 2024-03-27 NOTE — Telephone Encounter (Signed)
 Pt is sick and canceled his bone marrow bx this morning. Pt scheduled for MD on 12/29 for results. I told pt I would let Dr.B and his team know and they will be able to reach out to pt with any updates on appts.

## 2024-03-28 ENCOUNTER — Telehealth: Payer: Self-pay | Admitting: *Deleted

## 2024-03-28 NOTE — Telephone Encounter (Signed)
 Patient cancelled BM Bx due to illness. Re scheduled for Weds 04/10/24 Arrival time 7:30 am[facility]. Nothing to eat or drink after midnight. He will have a driver. Pt confirmed this appt.

## 2024-04-08 ENCOUNTER — Inpatient Hospital Stay: Admitting: Internal Medicine

## 2024-04-08 ENCOUNTER — Other Ambulatory Visit: Payer: Self-pay

## 2024-04-09 ENCOUNTER — Telehealth: Payer: Self-pay | Admitting: *Deleted

## 2024-04-09 ENCOUNTER — Telehealth: Payer: Self-pay

## 2024-04-09 NOTE — Telephone Encounter (Signed)
 Patient called and wanted to review the time of his bone marrow biopsy that is scheduled for tom. Patient instructed that the apt is on Wed 04/10/24 with an arrival time of 7:30 am. He needs to [facility]. He should not have anything to eat or drink after midnight. He will have a driver. He thanked me for reviewing the information.

## 2024-04-09 NOTE — Telephone Encounter (Signed)
 Patient for IR Bone Marrow Biopsy on Wed 04/10/24, I called and spoke with the patient on the phone and gave pre-procedure instructions. Pt was made aware to be here at 7:30a, NPO after MN prior to procedure as well as driver post procedure/recovery/discharge. Pt stated understanding.  Called 04/09/24

## 2024-04-09 NOTE — Progress Notes (Signed)
 "   Chief Complaint:  Malignant Lymphoma  Procedure: Bone Marrow Biopsy with Aspiration  Referring Provider(s): Dr. KANDICE Joe  Supervising Physician: Jenna Hacker  Patient Status: [facility] - Out-pt  History of Present Illness: Terry Douglas is a 68 y.o. male with a history of anemia, CKD, and stage IV lymphoplasmacytic lymphoma/marginal zone lymphoma who was previously on surveillance with Dr. Joe. At his last few visits he has been noted to have a low, but steady elevation in disease markers including a gradual increase in M protein levels (0.2 to 0.8) and rising kappa light chain values. For this reason, Dr. Joe recommended updated PET scan and bone marrow biopsy. PET scan completed on 12/16 revealed small, mildly hypermetabolic axillary and left cervical nodes, favored to be reactive, with indolent small volume lymphoma less likely. Patient presents today for his bone marrow biopsy.   Patient is currently resting in bed in no acute distress. States that he has been doing well at home. Denies any fevers/chills, chest pain, shortness of breath, abdominal pain, nausea/vomiting, weight loss, or night sweats. NPO since midnight. All questions and concerns answered at the bedside.   Patient is Full Code  Past Medical History:  Diagnosis Date   Anemia    Arthritis    Chronic kidney disease    GERD (gastroesophageal reflux disease)    Non Hodgkin's lymphoma (HCC)     Past Surgical History:  Procedure Laterality Date   BONE MARROW BIOPSY     Left eye surgery  2012   Left knee surgery  1981   ACL repair    Allergies: No known allergies  Medications: Prior to Admission medications  Medication Sig Start Date End Date Taking? Authorizing Provider  cyanocobalamin (VITAMIN B12) 1000 MCG tablet Take 1,000 mcg by mouth daily.    [provider]  Fe Bisgly-Vit C-Vit B12-FA (GENTLE IRON PO) Take by mouth daily.    [provider]  omeprazole (PRILOSEC)  20 MG capsule TAKE ONE CAPSULE BY MOUTH EVERY MORNING TO CONTROL STOMACH ACID.  TAKE 30 MINUTES BEFORE A MEAL 11/18/20   [provider]     Family History  Problem Relation Age of Onset   Pancreatic cancer Mother 12   Prostate cancer Brother 55   Prostate cancer Father 64    Social History   Socioeconomic History   Marital status: Single    Spouse name: Not on file   Number of children: Not on file   Years of education: Not on file   Highest education level: Not on file  Occupational History   Not on file  Tobacco Use   Smoking status: Every Day    Current packs/day: 0.25    Average packs/day: 0.3 packs/day for 10.0 years (2.5 ttl pk-yrs)    Types: Cigarettes   Smokeless tobacco: Never   Tobacco comments:    I only smoke in the bar on occasion  Substance and Sexual Activity   Alcohol use: Yes    Alcohol/week: 0.0 standard drinks of alcohol    Comment: consumes alcohol on the weekend.   Drug use: No   Sexual activity: Never  Other Topics Concern   Not on file  Social History Narrative   Not on file   Social Drivers of Health   Tobacco Use: High Risk (03/18/2024)   Patient History    Smoking Tobacco Use: Every Day    Smokeless Tobacco Use: Never    Passive Exposure: Not on file  Financial Resource Strain:  Not on file  Food Insecurity: Not on file  Transportation Needs: Not on file  Physical Activity: Not on file  Stress: Not on file  Social Connections: Not on file  Depression (PHQ2-9): Low Risk (03/18/2024)   Depression (PHQ2-9)    PHQ-2 Score: 0  Alcohol Screen: Not on file  Housing: Not on file  Utilities: Not on file  Health Literacy: Not on file    Review of Systems Patient denies any headache, chest pain, shortness of breath, abdominal pain, N/V, or fever/chills. All other systems are negative.   Vital Signs: BP 126/82   Pulse 89   Temp 98.2 F (36.8 C) (Tympanic)   Resp 20   Ht 5' 6 (1.676 m)   Wt 144 lb 13.5 oz (65.7 kg)   SpO2  100%   BMI 23.38 kg/m    Physical Exam Vitals reviewed.  Constitutional:      Appearance: Normal appearance.  HENT:     Mouth/Throat:     Mouth: Mucous membranes are moist.     Pharynx: Oropharynx is clear.  Cardiovascular:     Rate and Rhythm: Normal rate and regular rhythm.     Heart sounds: Normal heart sounds.  Pulmonary:     Effort: Pulmonary effort is normal.     Breath sounds: Normal breath sounds.  Abdominal:     General: Abdomen is flat.     Palpations: Abdomen is soft.     Tenderness: There is no abdominal tenderness.  Skin:    General: Skin is warm and dry.  Neurological:     Mental Status: He is alert and oriented to person, place, and time.  Psychiatric:        Behavior: Behavior normal.     Imaging: NM PET Image Restage (PS) Skull Base to Thigh (F-18 FDG) Result Date: 03/30/2024 EXAM: PET AND CT SKULL BASE TO MID THIGH 03/26/2024 11:20:15 AM TECHNIQUE: RADIOPHARMACEUTICAL: 7.79 mCi F-18 FDG Uptake time 60 minutes. Glucose level 86 mg/dl. Blood pool SUV 2.2; liver SUV 3.0. PET imaging was acquired from the base of the skull to the mid thighs. Non-contrast enhanced computed tomography was obtained for attenuation correction and anatomic localization. COMPARISON: Chest abdomen and pelvic CT of 04/17/2015. CLINICAL HISTORY: Malignant lymphoma. Finished chemotherapy in 2016. Repeat biopsy scheduled for tomorrow. FINDINGS: HEAD AND NECK: A left posterior triangle node measures 5 mm and SUV 2.4 on image 21/6. Right maxillary sinus mucous retention cyst or polyp. CHEST: Multiple bilateral axillary nodes which are diminutive and demonstrate low-level activity. Example left axillary node at 9 mm, with maintained fatty hilum. SUV 2.5 on image 41/6. There are no hypermetabolic mediastinal or hilar lymph nodes. No hypermetabolic pulmonary activity or suspicious nodularity. Tortuous thoracic aorta. LAD coronary artery calcification. ABDOMEN AND PELVIS: There is no hypermetabolic  activity within the liver, adrenal glands, spleen or pancreas. There is no hypermetabolic nodal activity in the abdomen or pelvis. Proximal gastric underdistention. Abdominal aortic atherosclerosis. Bilateral small bladder diverticula. Mild prostatomegaly. Physiologic activity within the gastrointestinal and genitourinary systems. BONES AND SOFT TISSUE: There is no hypermetabolic activity to suggest osseous metastatic disease. IMPRESSION: 1. Small, mildly hypermetabolic axillary and left cervical nodes are favored to be reactive, with indolent small volume lymphoma felt less likely. 2. Incidental findings include aortic atherosclerosis ([id number]), coronary artery atherosclerosis. prostatomegaly and bladder diverticula suggesting a component of outlet obstruction. Electronically signed by: Rockey Kilts MD 03/30/2024 08:14 PM EST RP Workstation: [id number]    Labs:  CBC: Recent Labs  08/14/23 0924 02/08/24 1003  WBC 8.6 7.7  HGB 12.2* 11.7*  HCT 37.3* 35.6*  PLT 300 297    COAGS: No results for input(s): INR, APTT in the last 8760 hours.  BMP: Recent Labs    08/14/23 0924 02/08/24 1003  NA 137 137  K 3.7 3.7  CL 106 107  CO2 24 22  GLUCOSE 109* 120*  BUN 18 20  CALCIUM 8.8* 9.1  CREATININE 1.82* 1.90*  GFRNONAA 40* 38*    LIVER FUNCTION TESTS: Recent Labs    08/14/23 0924 02/08/24 1003  BILITOT 0.3 0.6  AST 18 21  ALT 13 14  ALKPHOS 54 52  PROT 7.4 7.4  ALBUMIN 3.5 3.6    TUMOR MARKERS: No results for input(s): AFPTM, CEA, CA199, CHROMGRNA in the last 8760 hours.  Assessment and Plan:  Malignant Lymphoma: DEQUANE STRAHAN is a 68 y.o. male with a history of iron deficiency anemia and stage IV lymphoplasmacytic lymphoma recently found to have increasing levels of certain disease markers, including M protein levels and kappa light chain values. He was referred by Dr. Rennie for a bone marrow biopsy to rule out disease progression. He presents to  [facility] Interventional Radiology department for an image-guided bone marrow biopsy with aspiration with Dr. KANDICE Banner. Procedure to be performed under moderate sedation.  Risks and benefits of bone marrow biopsy was discussed with the patient and/or patient's family including, but not limited to bleeding, infection, damage to adjacent structures or low yield requiring additional tests.  All of the questions were answered and there is agreement to proceed.  Consent signed and in chart.   Thank you for allowing our service to participate in SHIVAM MESTAS 's care.    Electronically Signed: Milinda Sweeney M Colbie Sliker, PA-C   04/10/2024, 8:16 AM     I spent a total of 30 Minutes in face to face in clinical consultation, greater than 50% of which was counseling/coordinating care for bone marrow biopsy with aspiration. "

## 2024-04-10 ENCOUNTER — Ambulatory Visit
Admission: RE | Admit: 2024-04-10 | Discharge: 2024-04-10 | Disposition: A | Discharge status: Home / Self Care | Source: Ambulatory Visit | Attending: Internal Medicine | Admitting: Internal Medicine

## 2024-04-10 VITALS — BP 112/79 | HR 77 | Temp 98.2°F | Resp 15 | Ht 66.0 in | Wt 144.8 lb

## 2024-04-10 DIAGNOSIS — C8300 Small cell B-cell lymphoma, unspecified site: Secondary | ICD-10-CM | POA: Diagnosis present

## 2024-04-10 DIAGNOSIS — F17210 Nicotine dependence, cigarettes, uncomplicated: Secondary | ICD-10-CM | POA: Diagnosis not present

## 2024-04-10 DIAGNOSIS — Z1379 Encounter for other screening for genetic and chromosomal anomalies: Secondary | ICD-10-CM | POA: Diagnosis not present

## 2024-04-10 DIAGNOSIS — D509 Iron deficiency anemia, unspecified: Secondary | ICD-10-CM | POA: Insufficient documentation

## 2024-04-10 DIAGNOSIS — N189 Chronic kidney disease, unspecified: Secondary | ICD-10-CM | POA: Diagnosis not present

## 2024-04-10 HISTORY — PX: IR BONE MARROW BIOPSY & ASPIRATION: IMG5727

## 2024-04-10 LAB — CBC WITH DIFFERENTIAL/PLATELET
Abs Immature Granulocytes: 0.02 K/uL (ref 0.00–0.07)
Basophils Absolute: 0.1 K/uL (ref 0.0–0.1)
Basophils Relative: 1 %
Eosinophils Absolute: 0.3 K/uL (ref 0.0–0.5)
Eosinophils Relative: 3 %
HCT: 37.4 % — ABNORMAL LOW (ref 39.0–52.0)
Hemoglobin: 11.9 g/dL — ABNORMAL LOW (ref 13.0–17.0)
Immature Granulocytes: 0 %
Lymphocytes Relative: 24 %
Lymphs Abs: 2 K/uL (ref 0.7–4.0)
MCH: 29 pg (ref 26.0–34.0)
MCHC: 31.8 g/dL (ref 30.0–36.0)
MCV: 91 fL (ref 80.0–100.0)
Monocytes Absolute: 1.2 K/uL — ABNORMAL HIGH (ref 0.1–1.0)
Monocytes Relative: 14 %
Neutro Abs: 4.9 K/uL (ref 1.7–7.7)
Neutrophils Relative %: 58 %
Platelets: 285 K/uL (ref 150–400)
RBC: 4.11 MIL/uL — ABNORMAL LOW (ref 4.22–5.81)
RDW: 15.9 % — ABNORMAL HIGH (ref 11.5–15.5)
WBC: 8.4 K/uL (ref 4.0–10.5)
nRBC: 0 % (ref 0.0–0.2)

## 2024-04-10 MED ORDER — LIDOCAINE 1 % OPTIME INJ - NO CHARGE
6.0000 mL | Freq: Once | INTRAMUSCULAR | Status: AC
  Administered 2024-04-10: 6 mL via INTRADERMAL

## 2024-04-10 MED ORDER — FENTANYL CITRATE (PF) 100 MCG/2ML IJ SOLN
INTRAMUSCULAR | Status: AC
  Filled 2024-04-10: qty 2

## 2024-04-10 MED ORDER — MIDAZOLAM HCL 2 MG/2ML IJ SOLN
INTRAMUSCULAR | Status: AC
  Filled 2024-04-10: qty 2

## 2024-04-10 MED ORDER — SODIUM CHLORIDE 0.9 % IV SOLN: INTRAVENOUS | Status: DC

## 2024-04-10 MED ORDER — HEPARIN SOD (PORK) LOCK FLUSH 100 UNIT/ML IV SOLN
INTRAVENOUS | Status: AC
  Filled 2024-04-10: qty 5

## 2024-04-10 MED ORDER — FENTANYL CITRATE (PF) 100 MCG/2ML IJ SOLN
INTRAMUSCULAR | Status: AC | PRN
  Administered 2024-04-10: 50 ug via INTRAVENOUS

## 2024-04-10 MED ORDER — MIDAZOLAM HCL (PF) 2 MG/2ML IJ SOLN
INTRAMUSCULAR | Status: AC | PRN
  Administered 2024-04-10: 1 mg via INTRAVENOUS

## 2024-04-10 NOTE — Progress Notes (Signed)
 Patient clinically stable post IR BMB per Dr Jenna rear well. Vitals stable post procedure. Received Versed 1 mg along with Fentanyl 50 mcg IV for procedure. Report given to Jann Parker Rn post procedure/specials/14

## 2024-04-10 NOTE — Procedures (Signed)
 Interventional Radiology Procedure Note  Procedure: Fluoro guided Bone Marrow biopsy (iliac bone)  Complications: None  Estimated Blood Loss: < 10 mL  Findings: Fluoro guided bone marrow biopsy of the left iliac bone.  Aspirate and core samples sent to pathology for further processing.  Terry DELENA Banner, MD

## 2024-04-10 NOTE — Discharge Instructions (Signed)
 Bone Marrow Aspiration and Bone Marrow Biopsy, Adult, Care After This sheet gives you information about how to care for yourself after your procedure. If you have problems or questions, contact your health care provider.  What can I expect after the procedure?  After the procedure, it is common to have: Mild pain and tenderness. Swelling. Bruising.  Follow these instructions at home: Take over-the-counter or prescription medicines only as told by your health care provider. You may shower tomorrow Remove band aid tomorrow, replace with another bandaid if  site has any drainage from biopsy site. Wash your hands with soap and water before you touch your biopsy site  If soap and water are not available, use hand sanitizer. Change your dressing frequently for bleeding and/or drainage. Check your puncture site every day for signs of infection. Check for: More redness, swelling, or pain. More fluid or blood. Warmth. Pus or a bad smell. Return to your normal activities in 24hours.  Do not drive for 24 hours if you were given a medicine to help you relax (sedative). Keep all follow-up visits as told by your health care provider. This is important. Contact a health care provider if: You have more redness, swelling, or pain around the puncture site. You have more fluid or blood coming from the puncture site. Your puncture site feels warm to the touch. You have pus or a bad smell coming from the puncture site. You have a fever. Your pain is not controlled with medicine. This information is not intended to replace advice given to you by your health care provider. Make sure you discuss any questions you have with your health care provider. Document Released: 10/15/2004 Document Revised: 10/16/2015 Document Reviewed: 09/09/2015 Elsevier Interactive Patient Education  2018 [employer].

## 2024-04-15 LAB — SURGICAL PATHOLOGY

## 2024-04-17 ENCOUNTER — Inpatient Hospital Stay: Attending: Internal Medicine | Admitting: Internal Medicine

## 2024-04-17 ENCOUNTER — Telehealth: Payer: Self-pay | Admitting: Pharmacist

## 2024-04-17 ENCOUNTER — Encounter (HOSPITAL_COMMUNITY): Payer: Self-pay

## 2024-04-17 ENCOUNTER — Encounter: Payer: Self-pay | Admitting: Internal Medicine

## 2024-04-17 VITALS — BP 136/87 | HR 68 | Temp 98.1°F | Resp 16 | Ht 66.0 in | Wt 147.2 lb

## 2024-04-17 DIAGNOSIS — Z800 Family history of malignant neoplasm of digestive organs: Secondary | ICD-10-CM | POA: Insufficient documentation

## 2024-04-17 DIAGNOSIS — N183 Chronic kidney disease, stage 3 unspecified: Secondary | ICD-10-CM | POA: Diagnosis not present

## 2024-04-17 DIAGNOSIS — C8300 Small cell B-cell lymphoma, unspecified site: Secondary | ICD-10-CM

## 2024-04-17 DIAGNOSIS — Z8042 Family history of malignant neoplasm of prostate: Secondary | ICD-10-CM | POA: Insufficient documentation

## 2024-04-17 DIAGNOSIS — I2510 Atherosclerotic heart disease of native coronary artery without angina pectoris: Secondary | ICD-10-CM | POA: Insufficient documentation

## 2024-04-17 DIAGNOSIS — N400 Enlarged prostate without lower urinary tract symptoms: Secondary | ICD-10-CM | POA: Insufficient documentation

## 2024-04-17 DIAGNOSIS — Z79899 Other long term (current) drug therapy: Secondary | ICD-10-CM | POA: Insufficient documentation

## 2024-04-17 DIAGNOSIS — D649 Anemia, unspecified: Secondary | ICD-10-CM | POA: Diagnosis not present

## 2024-04-17 DIAGNOSIS — F17210 Nicotine dependence, cigarettes, uncomplicated: Secondary | ICD-10-CM | POA: Diagnosis not present

## 2024-04-17 MED ORDER — ZANUBRUTINIB 160 MG PO TABS: 320.0000 mg | ORAL_TABLET | Freq: Every day | ORAL | 1 refills | Status: AC

## 2024-04-17 NOTE — Progress Notes (Signed)
 [facility] OFFICE PROGRESS NOTE  Patient Care Team: Center, [facility] as PCP - General (General Practice) Rennie Cindy SAUNDERS, MD as Consulting Physician (Oncology)   SUMMARY OF ONCOLOGIC HISTORY:  . 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 CT [non-contrast]- January 2017- NED  DEC 2025- BONE MARROW, ASPIRATE, CLOT, CORE:  - Findings consistent with a B-cell lymphoproliferative disorder  involving approximately 40% of the overall marrow.   PERIPHERAL BLOOD:  - Normochromic normocytic anemia   Note: The clinical history of either a marginal zone lymphoma or  lymphoplasmacytic lymphoma is noted.  The marrow is involved in both and  interstitial and paratrabecular pattern by B cells with a nonspecific  immunophenotype (i.e. CD5/CD10/BCL6 negative.  A nonspecific  immunophenotype can represent either the aforementioned marginal zone  lymphoma or lymphoplasmacytic lymphoma.  Conventional cytogenetics as  well as a lymphoid disorders NexGen panel are pending to further  characterize the lymphoproliferative disorde   # CKD [stage III creat 1.8]  # JAN 2026- start zanubrutinib .   DIAGNOSIS: lymphoplasmacytic lymphoma/Waldenstroms  STAGE:   IV      ;GOALS: control     Malignant lymphoma, lymphoplasmacytic (HCC)    INTERVAL HISTORY: Alone.  Ambulating independently.  A very pleasant 69 -year-old male patient with above history of stage IV lymphoplasmacytic lymphoma/marginal zone lymphoma currently status post maintenance rituximab  [last treatment November 2016] currently on surviellaince.    Discussed the use of AI scribe software for clinical note transcription with the  patient, who gave verbal consent to proceed.  History of Present Illness   Terry Douglas is a 69 year old male with Waldenstrom macroglobulinemia who presents for review of recent bone marrow biopsy and PET scan results and discussion of treatment options.  He remains under surveillance for Waldenstrom macroglobulinemia. Recent PET scan demonstrated minimally enlarged lymph nodes in the left cervical and axillary regions; no abnormal uptake was seen elsewhere. He is asymptomatic, with no new or enlarging lymphadenopathy, fevers, chills, or night sweats.  Bone marrow biopsy revealed approximately 40% marrow involvement by lymphoplasmacytic lymphoma. His last lymphoma treatment was a single dose of rituximab  in 2014, with no subsequent chemotherapy. He has not experienced B symptoms or other complications related to lymphoma progression.  Chronic kidney disease is present, with recent laboratory values showing a decline in renal function (creatinine in the 40s-50s, previously 38). He is asymptomatic from renal dysfunction. Renal function is being considered in the selection and dosing of potential lymphoma therapies.  Incidental findings on PET scan included coronary artery calcification and prostatic enlargement. He denies urinary symptoms and has no prior history of prostate disease or urological evaluation.        Review of Systems  Constitutional:  Negative for chills, diaphoresis, fever, malaise/fatigue and weight loss.  HENT:  Negative for nosebleeds and sore throat.   Eyes:  Negative for double vision.  Respiratory:  Negative for cough, hemoptysis, sputum production, shortness of breath and wheezing.   Cardiovascular:  Negative for chest pain, palpitations, orthopnea and leg swelling.  Gastrointestinal:  Negative for abdominal pain, blood in stool, constipation, diarrhea, heartburn, melena, nausea and vomiting.  Genitourinary:  Negative for dysuria, frequency and urgency.   Musculoskeletal:  Negative for back pain and joint pain.  Skin: Negative.  Negative for itching and rash.  Neurological:  Negative for dizziness, tingling, focal weakness, weakness and headaches.  Endo/Heme/Allergies:  Does not bruise/bleed easily.  Psychiatric/Behavioral:  Negative for depression. The patient is not nervous/anxious and does not have insomnia.      PAST MEDICAL HISTORY :  Past Medical History:  Diagnosis Date   Anemia    Arthritis    Chronic kidney disease    GERD (gastroesophageal reflux disease)    Non Hodgkin's lymphoma (HCC)     PAST SURGICAL HISTORY :   Past Surgical History:  Procedure Laterality Date   BONE MARROW BIOPSY     IR BONE MARROW BIOPSY & ASPIRATION  04/10/2024   Left eye surgery  2012   Left knee surgery  1981   ACL repair    FAMILY HISTORY :   Family History  Problem Relation Age of Onset   Pancreatic cancer Mother 38   Prostate cancer Brother 64   Prostate cancer Father 47    SOCIAL HISTORY:   Social History   Tobacco Use   Smoking status: Every Day    Current packs/day: 0.25    Average packs/day: 0.3 packs/day for 10.0 years (2.5 ttl pk-yrs)    Types: Cigarettes   Smokeless tobacco: Never   Tobacco comments:    I only smoke in the bar on occasion  Substance Use Topics   Alcohol use: Yes    Alcohol/week: 0.0 standard drinks of alcohol    Comment: consumes alcohol on the weekend.   Drug use: No    ALLERGIES:  is allergic to no known allergies.  MEDICATIONS:  Current Outpatient Medications  Medication Sig Dispense Refill   cyanocobalamin (VITAMIN B12) 1000 MCG tablet Take 1,000 mcg by mouth daily.     Fe Bisgly-Vit C-Vit B12-FA (GENTLE IRON PO) Take by mouth daily.     omeprazole (PRILOSEC) 20 MG capsule TAKE ONE CAPSULE BY MOUTH EVERY MORNING TO CONTROL STOMACH ACID.  TAKE 30 MINUTES BEFORE A MEAL     zanubrutinib  (BRUKINSA ) 160 MG tablet Take 2 tablets (320 mg total) by mouth daily. 60 tablet 1   No current  facility-administered medications for this visit.    PHYSICAL EXAMINATION: ECOG PERFORMANCE STATUS: 0 - Asymptomatic  BP 136/87 (BP Location: Left Arm, Patient Position: Sitting, Cuff Size: Normal)   Pulse 68   Temp 98.1 F (36.7 C) (Tympanic)   Resp 16   Ht 5' 6 (1.676 m)   Wt 147 lb 3.2 oz (66.8 kg)   SpO2 95%   BMI 23.76 kg/m   Filed Weights   04/17/24 1431  Weight: 147 lb 3.2 oz (66.8 kg)    Physical Exam Constitutional:      Comments: He is alone.   HENT:     Head: Normocephalic and atraumatic.     Mouth/Throat:     Pharynx: No oropharyngeal exudate.  Eyes:     Pupils: Pupils are equal, round, and reactive to light.  Cardiovascular:     Rate and Rhythm: Normal rate and regular rhythm.  Pulmonary:     Effort: No respiratory distress.     Breath sounds: No wheezing.  Abdominal:     General: Bowel sounds are normal. There is no distension.     Palpations: Abdomen is soft. There is no mass.     Tenderness: There is no abdominal tenderness. There is no guarding or rebound.  Musculoskeletal:        General: No tenderness. Normal range  of motion.     Cervical back: Normal range of motion and neck supple.  Skin:    General: Skin is warm.  Neurological:     Mental Status: He is alert and oriented to person, place, and time.  Psychiatric:        Mood and Affect: Affect normal.     LABORATORY DATA:  I have reviewed the data as listed    Component Value Date/Time   NA 137 02/08/2024 1003   NA 139 11/08/2013 0824   K 3.7 02/08/2024 1003   K 4.3 11/08/2013 0824   CL 107 02/08/2024 1003   CL 104 11/08/2013 0824   CO2 22 02/08/2024 1003   CO2 27 11/08/2013 0824   GLUCOSE 120 (H) 02/08/2024 1003   GLUCOSE 92 11/08/2013 0824   BUN 20 02/08/2024 1003   BUN 18 11/08/2013 0824   CREATININE 1.90 (H) 02/08/2024 1003   CREATININE 1.85 (H) 04/25/2014 0840   CALCIUM 9.1 02/08/2024 1003   CALCIUM 8.8 11/08/2013 0824   PROT 7.4 02/08/2024 1003   PROT 6.6 02/28/2014  0902   ALBUMIN 3.6 02/08/2024 1003   ALBUMIN 3.5 02/28/2014 0902   AST 21 02/08/2024 1003   ALT 14 02/08/2024 1003   ALT 19 02/28/2014 0902   ALKPHOS 52 02/08/2024 1003   ALKPHOS 53 02/28/2014 0902   BILITOT 0.6 02/08/2024 1003   GFRNONAA 38 (L) 02/08/2024 1003   GFRNONAA 40 (L) 04/25/2014 0840   GFRNONAA 46 (L) 11/08/2013 0824   GFRAA >60 11/08/2019 0915   GFRAA 48 (L) 04/25/2014 0840   GFRAA 53 (L) 11/08/2013 0824    No results found for: SPEP, UPEP  Lab Results  Component Value Date   WBC 8.4 04/10/2024   NEUTROABS 4.9 04/10/2024   HGB 11.9 (L) 04/10/2024   HCT 37.4 (L) 04/10/2024   MCV 91.0 04/10/2024   PLT 285 04/10/2024      Chemistry      Component Value Date/Time   NA 137 02/08/2024 1003   NA 139 11/08/2013 0824   K 3.7 02/08/2024 1003   K 4.3 11/08/2013 0824   CL 107 02/08/2024 1003   CL 104 11/08/2013 0824   CO2 22 02/08/2024 1003   CO2 27 11/08/2013 0824   BUN 20 02/08/2024 1003   BUN 18 11/08/2013 0824   CREATININE 1.90 (H) 02/08/2024 1003   CREATININE 1.85 (H) 04/25/2014 0840      Component Value Date/Time   CALCIUM 9.1 02/08/2024 1003   CALCIUM 8.8 11/08/2013 0824   ALKPHOS 52 02/08/2024 1003   ALKPHOS 53 02/28/2014 0902   AST 21 02/08/2024 1003   ALT 14 02/08/2024 1003   ALT 19 02/28/2014 0902   BILITOT 0.6 02/08/2024 1003       ASSESSMENT & PLAN:   Malignant lymphoma, lymphoplasmacytic (HCC) # 2014-  Malignant lymphoplasmacytic lymphoma-stage IV low-grade-completed maintenance Rituxan  02/2015.  CT 1/17 showed no evidence of disease.  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 Dec 16th, 2025- 1. Small, mildly hypermetabolic axillary and left cervical nodes are favored to be reactive, with indolent small volume lymphoma felt less likely.  However clinically suggestive of lymphoma involvement.  However hold off any lymph node biopsy at this time.  Will reevaluate and subsequent PET scan  posttreatment . DEC 2025-bone marrow biopsy:- Findings consistent with a B-cell lymphoproliferative disorder  involving approximately 40% of the overall marrow. Conventional cytogenetics -unremarkable; w  lymphoid disorders NexGen panel are pending to further  characterize the lymphoproliferative disorder-marginal zone lymphoma versus Waldenstrm's.  Will also review at the tumor conference.  # I do long discussion the patient regarding multiple options including surveillance versus single agent Rituxan  versus Bendamustine rituximab  versus Zanubrutinib .  Discussed the pros and cons of each option-decided proceed with Zanubrutinib .  Discussed with pharmacy no dose reductions needed for renal function.  Patient understands the treatments indefinite, or until progression of disease.  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    # CKD: Stage- III   stable; pt asymptomatic- incidental on PET- DEC 2025-  prostatomegaly and bladder diverticula suggesting acomponent of outlet obstruction- will refer to urology  #Incidental findings on Imaging  CT , 2025:   Incidental findings include aortic atherosclerosis, coronary artery atherosclerosis. prostatomegaly and bladder diverticula suggesting a component of outlet obstruction. I reviewed/discussed/counseled the patient.   .# DISPOSITION: # refer to urology- prostatomegaly/  outlet obstruction # follow up in 1 month MD; labs- cbc/cmp; MM panel K/l light chains- Dr.B  # I reviewed the blood work- with the patient in detail; also reviewed the imaging independently [as summarized above]; and with the patient in detail.   # 40 minutes face-to-face with the patient discussing the above plan of care; more than 50% of time spent on prognosis/ natural history; counseling and coordination.      Cindy JONELLE Joe, MD 04/17/2024 3:39 PM

## 2024-04-17 NOTE — Telephone Encounter (Signed)
 Clinical Pharmacist Practitioner Encounter   Received new prescription for Brukinsa  (Zanubrutinib ) for the treatment of lymphoplasmacytic lymphoma/Waldenstrm's, planned duration until disease progression or unacceptable drug toxicity.  CMP from 02/08/24 assessed, no relevant lab abnormalities. Prescription dose and frequency assessed.   Current medication list in Epic reviewed, no DDIs with Zanubrutinib  identified:  Evaluated chart and no patient barriers to medication adherence identified.   Prescription has been e-scribed to the [facility] for benefits analysis and approval.  Oral Oncology Clinic will continue to follow for insurance authorization, copayment issues, initial counseling and start date.   Jameson Tormey N. Ermal Brzozowski, PharmD, BCOP, CPP Hematology/Oncology Clinical Pharmacist [facility] Oral Chemotherapy Navigation Clinic [phone]  04/17/2024 3:10 PM

## 2024-04-17 NOTE — Progress Notes (Signed)
 PET 03/26/24, BM bx 04/10/24.

## 2024-04-17 NOTE — Assessment & Plan Note (Addendum)
#   2014-  Malignant lymphoplasmacytic lymphoma-stage IV low-grade-completed maintenance Rituxan  02/2015.  CT 1/17 showed no evidence of disease.  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 Dec 16th, 2025- 1. Small, mildly hypermetabolic axillary and left cervical nodes are favored to be reactive, with indolent small volume lymphoma felt less likely.  However clinically suggestive of lymphoma involvement.  However hold off any lymph node biopsy at this time.  Will reevaluate and subsequent PET scan posttreatment . DEC 2025-bone marrow biopsy:- Findings consistent with a B-cell lymphoproliferative disorder  involving approximately 40% of the overall marrow. Conventional cytogenetics -unremarkable; w  lymphoid disorders NexGen panel are pending to further  characterize the lymphoproliferative disorder-marginal zone lymphoma versus Waldenstrm's.  Will also review at the tumor conference.  # I do long discussion the patient regarding multiple options including surveillance versus single agent Rituxan  versus Bendamustine rituximab  versus Zanubrutinib .  Discussed the pros and cons of each option-decided proceed with Zanubrutinib .  Discussed with pharmacy no dose reductions needed for renal function.  Patient understands the treatments indefinite, or until progression of disease.  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    # CKD: Stage- III   stable; pt asymptomatic- incidental on PET- DEC 2025-  prostatomegaly and bladder diverticula suggesting acomponent of outlet obstruction- will refer to urology  #Incidental findings on Imaging  CT , 2025:   Incidental findings include aortic atherosclerosis, coronary artery atherosclerosis. prostatomegaly and bladder diverticula suggesting a component of outlet obstruction. I reviewed/discussed/counseled the patient.   .#  DISPOSITION: # refer to urology- prostatomegaly/  outlet obstruction # follow up in 1 month MD; labs- cbc/cmp; MM panel K/l light chains- Dr.B  # I reviewed the blood work- with the patient in detail; also reviewed the imaging independently [as summarized above]; and with the patient in detail.   # 40 minutes face-to-face with the patient discussing the above plan of care; more than 50% of time spent on prognosis/ natural history; counseling and coordination.

## 2024-04-18 ENCOUNTER — Telehealth: Payer: Self-pay | Admitting: Pharmacy Technician

## 2024-04-18 ENCOUNTER — Other Ambulatory Visit (HOSPITAL_COMMUNITY): Payer: Self-pay

## 2024-04-18 NOTE — Telephone Encounter (Signed)
 Oral Oncology Patient Advocate Encounter  After completing a benefits investigation, patient does not have part D benefits. He only has medical insurance.  Patient will need to do PAP.   Evy Lutterman (Patty) Chet Burnet, CPhT  [facility] [place], [place], [facility] - Oral Chemotherapy Patient Advocate Specialist III Phone: [phone]  Fax: [phone]

## 2024-04-19 ENCOUNTER — Telehealth: Payer: Self-pay | Admitting: Pharmacy Technician

## 2024-04-19 NOTE — Telephone Encounter (Signed)
 Oral Oncology Patient Advocate Encounter  Confirmed with patient that he did not receive a referral from the [place].  Acasia Skilton (Patty) Chet Burnet, CPhT  [facility], [place], [facility] - Oral Chemotherapy Patient Advocate Specialist III Phone: [phone]  Fax: [phone]

## 2024-04-19 NOTE — Telephone Encounter (Signed)
 Oral Oncology Patient Advocate Encounter   Began application for assistance for Brukinsa  through [employer].   Application will be submitted upon completion of necessary supporting documentation.   [employer] phone number [phone]. [employer] fax number [phone].   Pending MD signature and patient income/signatures.  Patient requested to receive SSI letter via mail and It will arrive to patient's home in ~7 business days.  Patient knows to call me as soon as he receives SSI letter.  Terry Douglas (Patty) Chet Burnet, CPhT  [facility], [place], [facility] - Oral Chemotherapy Patient Advocate Specialist III Phone: [phone]  Fax: [phone]

## 2024-04-23 NOTE — Telephone Encounter (Signed)
 Oral Oncology Patient Advocate Encounter  I have received all required information except for the signed attestation letter stating patient does not file taxes.  Letter has been set on MD's desk.  Myha Arizpe (Patty) Chet Burnet, CPhT  [facility], [place], [facility] - Oral Chemotherapy Patient Advocate Specialist III Phone: [phone]  Fax: [phone]

## 2024-04-23 NOTE — Telephone Encounter (Signed)
 Oral Oncology Patient Advocate Encounter   Submitted application for assistance for Brukinsa  to [employer].   Application submitted via e-fax to [phone]   [employer] phone number [phone].   I will continue to check the status until final determination.   Wylee Ogden (Patty) Chet Burnet, CPhT  [facility], [place], [facility] - Oral Chemotherapy Patient Advocate Specialist III Phone: [phone]  Fax: [phone]

## 2024-04-24 ENCOUNTER — Encounter (HOSPITAL_COMMUNITY): Payer: Self-pay

## 2024-04-24 ENCOUNTER — Encounter: Payer: Self-pay | Admitting: Urology

## 2024-04-24 ENCOUNTER — Ambulatory Visit: Admitting: Urology

## 2024-04-24 VITALS — BP 118/76 | HR 69 | Ht 66.0 in | Wt 152.4 lb

## 2024-04-24 DIAGNOSIS — N400 Enlarged prostate without lower urinary tract symptoms: Secondary | ICD-10-CM | POA: Insufficient documentation

## 2024-04-24 DIAGNOSIS — N401 Enlarged prostate with lower urinary tract symptoms: Secondary | ICD-10-CM

## 2024-04-24 DIAGNOSIS — Z125 Encounter for screening for malignant neoplasm of prostate: Secondary | ICD-10-CM | POA: Insufficient documentation

## 2024-04-24 NOTE — Progress Notes (Signed)
 "  04/24/2024 10:28 AM   Renny ONEIDA Gavel Mar 08, 1956 [id number]   HPI: 69 y.o. male here for initial evaluation of BPH/LUTS  Recent PET scan for lymphoma (03/30/2024)  - Incidentally noted enlarged prostate with bladder diverticula  History of stage IV lymphoma, blood dyscrasia (lymphoma initially diagnosed 2014) History of CKD, GFR 40-50s  Today, first time meeting, pleasant gentleman  - Never seen a urology  - Denies bothersome LUTS, subjective normal stream and bladder emptying, denies UTIs, GH  -Not on any urology medications  Fhx+ prostate Ca  - father, dx age 55s, unknown treatment  - brother, dx age 32s, unknown treatment  Patient is prior Research scientist (life sciences)    PMH: Past Medical History:  Diagnosis Date   Anemia    Arthritis    Chronic kidney disease    GERD (gastroesophageal reflux disease)    Non Hodgkin's lymphoma (HCC)     Surgical History: Past Surgical History:  Procedure Laterality Date   BONE MARROW BIOPSY     IR BONE MARROW BIOPSY & ASPIRATION  04/10/2024   Left eye surgery  2012   Left knee surgery  1981   ACL repair    Family History: Family History  Problem Relation Age of Onset   Pancreatic cancer Mother 58   Prostate cancer Brother 21   Prostate cancer Father 29    Social History:  reports that he has been smoking cigarettes. He has a 2.5 pack-year smoking history. He has never used smokeless tobacco. He reports current alcohol use. He reports that he does not use drugs.      Physical Exam: BP 118/76   Pulse 69   Ht 5' 6 (1.676 m)   Wt 152 lb 6.4 oz (69.1 kg)   BMI 24.60 kg/m    Constitutional:  Alert and oriented, No acute distress. Cardiovascular: No clubbing, cyanosis, or edema. Respiratory: Normal respiratory effort, no increased work of breathing. GI: Nondistended GU: DRE ~50g, limited exam due to discomfort, no apical nodules Skin: No rashes, bruises or suspicious lesions. Neurologic: Grossly intact, no focal deficits,  moving all 4 extremities. Psychiatric: Normal mood and affect.  Laboratory Data: PSA 1.0 (2013)   Pertinent Imaging: I have personally viewed and interpreted the PET/CT (03/26/2024)-40-50 g prostate by CT estimation, appears to have mild to moderate intravesical median lobe.  Otherwise normal appearing bilateral kidneys and ureters.  Bladder may have some diverticula..    Assessment & Plan:    Benign prostatic hyperplasia with lower urinary tract symptoms, symptom details unspecified Assessment & Plan: ~40-50g gland (via CT estimation) + moderate intravesical lobe, bladder diverticula  - suspect chronic BOO  - Asymptomatic, subjective normal urinary habits  - no UTIs  PVR 1ml today  Today we reviewed the physiology and common causes of male lower urinary tract symptoms (LUTS). Discussed potential etiologies including infectious, inflammatory, bladder-related, benign prostatic hyperplasia (BPH), and musculoskeletal/pelvic floor contributions. Reviewed the standard diagnostic workup (urinalysis, PVR, uroflow, prostate assessment, possible cystoscopy or imaging) and the spectrum of initial management strategies ranging from behavioral and lifestyle measures to pharmacologic therapy, with procedural options if indicated. All questions were addressed and the patient expressed understanding of the evaluation and treatment pathway.  Radiographic appearance of mildly enlarged BPH with possible underlying outlet obstruction.  Although asymptomatic, normal emptying with no red flag clinical symptoms.  At this point, I think okay for expectant management and continued monitoring.  Next steps would be a trial of empiric therapy Flomax versus finasteride.  Alternatively,  we could also consider office cystoscopy and evaluation of prostate bladder anatomy.  - RTC in 6 mo for symptom check  Orders: -     Bladder Scan (Post Void Residual) in office  Encounter for screening prostate specific antigen  (PSA) measurement Assessment & Plan: DRE - ~50g, limited exam due to discomfort, no apical nodules Fhx+ prostate Ca (father + brother) No recent PSA screening data (may have been through [place])  Due for routine PSA screening  - PSA today  Orders: -     PSA      Penne Skye, MD 04/24/2024  [facility] [address], [address] [place], [place] [address] [phone] "

## 2024-04-24 NOTE — Assessment & Plan Note (Addendum)
 DRE - ~50g, limited exam due to discomfort, no apical nodules Fhx+ prostate Ca (father + brother) No recent PSA screening data (may have been through [place])  Due for routine PSA screening  - PSA today

## 2024-04-24 NOTE — Assessment & Plan Note (Addendum)
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 Discussed potential etiologies including infectious, inflammatory, bladder-related, benign prostatic hyperplasia (BPH), and musculoskeletal/pelvic floor contributions. Reviewed the standard diagnostic workup (urinalysis, PVR, uroflow, prostate assessment, possible cystoscopy or imaging) and the spectrum of initial management strategies ranging from behavioral and lifestyle measures to pharmacologic therapy, with procedural options if indicated. All questions were addressed and the patient expressed understanding of the evaluation and treatment pathway.  Radiographic appearance of mildly enlarged BPH with possible underlying outlet obstruction.  Although asymptomatic, normal emptying with no red flag clinical symptoms.  At this point, I think okay for expectant management and continued monitoring.  Next steps would be a trial of empiric therapy Flomax versus finasteride.  Alternatively, we could also consider office cystoscopy and evaluation of prostate bladder anatomy.  - RTC in 6 mo for symptom check

## 2024-04-25 LAB — PSA: Prostate Specific Ag, Serum: 3.1 ng/mL (ref 0.0–4.0)

## 2024-04-25 NOTE — Telephone Encounter (Signed)
 Oral Oncology Patient Advocate Encounter   Received notification that the application for assistance for Brukinsa  through [employer] has been approved.   [employer] phone number [phone].   Effective dates: 04/24/2024 through 04/10/2025  Medication will be filled at [facility].  I have spoken to the patient.  Senaya Dicenso (Patty) Chet Burnet, CPhT  [facility], Zelda Salmon, [facility] - Oral Chemotherapy Patient Advocate Specialist III Phone: [phone]  Fax: [phone]

## 2024-05-01 ENCOUNTER — Encounter: Payer: Self-pay | Admitting: Pharmacist

## 2024-05-01 DIAGNOSIS — C8300 Small cell B-cell lymphoma, unspecified site: Secondary | ICD-10-CM

## 2024-05-01 NOTE — Patient Instructions (Signed)
 [facility] - A DEPT OF [facility]    Thank you for choosing [facility] to provide your oncology/hematology care and for allowing us  to participate in your care today!  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   Treatment goal: Control  Medication handout has been provided.   **For oral cancer medication prescription refill requests, contact your pharmacy and they will contact our office if needed. Allow 5-7 days for refills to be completed by your specialty pharmacy.    Cancer Center General Instructions:  If you have an appointment at the [facility], please go directly to the Cancer Center and check in at the registration area.  We strive to give you quality time with your provider. You may need to reschedule your appointment if you arrive late (15 or more minutes).  Arriving late affects you and other patients whose appointments are after yours.  Also, if you miss three or more appointments without notifying the office, you may be dismissed from the clinic at the provider's discretion.      BELOW ARE SYMPTOMS THAT SHOULD BE REPORTED IMMEDIATELY: *FEVER GREATER THAN 100.4 F (38 C) OR HIGHER *CHILLS OR SWEATING *NAUSEA AND VOMITING THAT IS NOT CONTROLLED WITH YOUR NAUSEA MEDICATION *UNUSUAL SHORTNESS OF BREATH *UNUSUAL BRUISING OR BLEEDING *URINARY PROBLEMS (pain or burning when urinating, or frequent urination) *BOWEL PROBLEMS (unusual diarrhea, constipation, pain near the anus) TENDERNESS IN MOUTH AND THROAT WITH OR WITHOUT PRESENCE OF ULCERS (sore throat, sores in mouth, or a toothache) UNUSUAL RASH, SWELLING OR PAIN  UNUSUAL VAGINAL DISCHARGE OR ITCHING   Items with * indicate a potential emergency and should be followed up as soon as possible or go to the  Emergency Department if any problems should occur.  Please show the CHEMOTHERAPY ALERT CARD at check-in to the Emergency Department and triage nurse.  Should you have questions after your visit or need to cancel or reschedule your appointment, please contact [facility] - A DEPT OF [facility]  [phone] and follow the prompts.  Office hours are 8:00 a.m. to 4:30 p.m. Monday - Friday. Please note that voicemails left after 4:00 p.m. may not be returned until the following business day.  We are closed weekends and major holidays. You have access to a nurse at all times for urgent questions. Please call the main number to the clinic [phone] and follow the prompts.  For any non-urgent questions, you may also contact your provider using MyChart. We now offer e-Visits for anyone 34 and older to request care online for non-urgent symptoms. For details visit [email or web address].   Also download the MyChart app! Go to the app store, search MyChart, open the app, select , and log in with your MyChart username and password.

## 2024-05-01 NOTE — Progress Notes (Signed)
 Clinical Pharmacist Practitioner Encounter   [facility] has [facility] his medication. He will begin taking his Brukinsa  tomorrow 05/02/24.  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   Treatment goal: Control  Counseled patient on administration, dosing, side effects, monitoring, drug-food interactions, safe handling, storage, and disposal. Patient will take 2 tablets (320 mg total) by mouth daily.   Side effects include but not limited to: fatigue, decreased wbc/hgb/plt, rash.    Reviewed with patient importance of keeping a medication schedule and plan for any missed doses.  After discussion with patient no patient barriers to medication adherence identified.   Distress evaluation: Distress thermometer completed during telephone call and reviewed with patient. Due to score, social work referral has not been sent.  Communication and Learning Assessment Primary learner: patient Barriers to learning: No barriers Preferred language: English Learning preferences: Listening Reading  Mr. Snowball voiced understanding and appreciation. All questions answered. Medication handout provided.  Provided patient with Oral Chemotherapy Navigation Clinic phone number. Patient knows to call the office with questions or concerns. Reviewed with patient the expectations for rescheduling or cancelling appointments.  Oral Chemotherapy Navigation Clinic will continue to follow.  Delmer Kowalski N. Onyx Schirmer, PharmD, BCOP, CPP Hematology/Oncology Clinical Pharmacist [facility] Oral Chemotherapy Navigation Clinic [phone]  05/01/2024 10:28 AM

## 2024-05-20 ENCOUNTER — Inpatient Hospital Stay

## 2024-05-20 ENCOUNTER — Inpatient Hospital Stay: Admitting: Internal Medicine

## 2024-10-23 ENCOUNTER — Ambulatory Visit: Admitting: Urology
